# Patient Record
Sex: Male | Born: 1980 | Race: White | Hispanic: No | Smoking: Never smoker
Health system: Southern US, Community
[De-identification: ages and names within clinical notes are randomized; demographics above are authoritative.]

## PROBLEM LIST (undated history)

## (undated) DIAGNOSIS — T7840XA Allergy, unspecified, initial encounter: Secondary | ICD-10-CM

## (undated) DIAGNOSIS — G4733 Obstructive sleep apnea (adult) (pediatric): Secondary | ICD-10-CM

## (undated) DIAGNOSIS — M818 Other osteoporosis without current pathological fracture: Secondary | ICD-10-CM

## (undated) DIAGNOSIS — I639 Cerebral infarction, unspecified: Secondary | ICD-10-CM

## (undated) DIAGNOSIS — L409 Psoriasis, unspecified: Secondary | ICD-10-CM

## (undated) DIAGNOSIS — L4050 Arthropathic psoriasis, unspecified: Secondary | ICD-10-CM

## (undated) HISTORY — DX: Cerebral infarction, unspecified: I63.9

## (undated) HISTORY — PX: TONSILLECTOMY: SUR1361

## (undated) HISTORY — DX: Allergy, unspecified, initial encounter: T78.40XA

## (undated) HISTORY — DX: Other osteoporosis without current pathological fracture: M81.8

## (undated) HISTORY — DX: Arthropathic psoriasis, unspecified: L40.50

## (undated) HISTORY — DX: Obstructive sleep apnea (adult) (pediatric): G47.33

## (undated) HISTORY — DX: Psoriasis, unspecified: L40.9

---

## 2010-09-01 ENCOUNTER — Ambulatory Visit: Payer: Self-pay | Admitting: General Surgery

## 2013-12-22 ENCOUNTER — Encounter: Payer: Self-pay | Admitting: Podiatry

## 2013-12-22 ENCOUNTER — Ambulatory Visit (INDEPENDENT_AMBULATORY_CARE_PROVIDER_SITE_OTHER): Payer: BC Managed Care – PPO | Admitting: Podiatry

## 2013-12-22 ENCOUNTER — Ambulatory Visit (INDEPENDENT_AMBULATORY_CARE_PROVIDER_SITE_OTHER): Payer: BC Managed Care – PPO

## 2013-12-22 VITALS — BP 130/78 | HR 70 | Resp 16 | Ht 66.0 in | Wt 260.0 lb

## 2013-12-22 DIAGNOSIS — M722 Plantar fascial fibromatosis: Secondary | ICD-10-CM

## 2013-12-22 MED ORDER — DICLOFENAC SODIUM 75 MG PO TBEC: 75.0000 mg | DELAYED_RELEASE_TABLET | Freq: Two times a day (BID) | ORAL | Status: DC

## 2013-12-22 MED ORDER — METHYLPREDNISOLONE (PAK) 4 MG PO TABS: ORAL_TABLET | ORAL | Status: DC

## 2013-12-22 NOTE — Progress Notes (Signed)
He presents today after having not seen him for a year with a chief complaint of bilateral heel pain. He states that he was pain-free for approximately 2 months and then his heel pains slowly returned. He denies any changes in his past medical history medications allergies or surgeries.  Objective: Vital signs are stable he is alert and oriented 3 pulses are strongly palpable bilateral. He has pain on palpation medial calcaneal tubercles bilateral.  Assessment: Plantar fasciitis bilateral.  Plan: Injected the bilateral heels today with Kenalog and local anesthetic. He was scanned for a pair orthotics. Started him on a Medrol Dosepak to be followed by diclofenac. He was dispensed plantar fascial braces bilateral. He will continue sleeping in his night splint without the wedge. He will continue appropriate shoe gear stretching exercises ice therapy.

## 2013-12-30 ENCOUNTER — Telehealth: Payer: Self-pay | Admitting: *Deleted

## 2013-12-30 NOTE — Telephone Encounter (Signed)
Left message for pt to call back to schedule an appt to pick up orthotics.

## 2014-01-14 ENCOUNTER — Encounter: Payer: Self-pay | Admitting: Podiatry

## 2014-01-14 ENCOUNTER — Ambulatory Visit (INDEPENDENT_AMBULATORY_CARE_PROVIDER_SITE_OTHER): Payer: BC Managed Care – PPO | Admitting: *Deleted

## 2014-01-14 VITALS — BP 130/78 | HR 70 | Resp 16

## 2014-01-14 DIAGNOSIS — M722 Plantar fascial fibromatosis: Secondary | ICD-10-CM

## 2014-01-14 NOTE — Patient Instructions (Signed)

## 2014-01-14 NOTE — Progress Notes (Signed)
Pt presents to pick up orthotics and went over wearing instructions. 

## 2014-02-23 ENCOUNTER — Ambulatory Visit (INDEPENDENT_AMBULATORY_CARE_PROVIDER_SITE_OTHER): Payer: BC Managed Care – PPO | Admitting: Podiatry

## 2014-02-23 VITALS — BP 112/68 | HR 92 | Resp 16

## 2014-02-23 DIAGNOSIS — M722 Plantar fascial fibromatosis: Secondary | ICD-10-CM

## 2014-02-24 NOTE — Progress Notes (Signed)
He presents today for follow-up of his plantar fasciitis bilaterally he also is following up for his orthotics. He states that the orthotics hurt after proximally for 5 hours of wearing them however his heels are starting to feel better.  Objective: Vital signs are stable he is alert and oriented 3. Pulses are palpable bilateral. Minimal pain on palpation medial tubercle right knee pain on palpation left heel.  Assessment: Resolving plantar fasciitis bilateral.  Plan: Continue the use of the orthotics discussed appropriate shoe gear stretching exercises ice therapy shoe modifications. He will also continue all other conservative therapies until completely well I will follow-up with him in a month if necessary.

## 2015-05-27 ENCOUNTER — Emergency Department (HOSPITAL_COMMUNITY): Payer: BLUE CROSS/BLUE SHIELD

## 2015-05-27 ENCOUNTER — Encounter (HOSPITAL_COMMUNITY): Payer: Self-pay | Admitting: Emergency Medicine

## 2015-05-27 ENCOUNTER — Inpatient Hospital Stay (HOSPITAL_COMMUNITY)
Admission: EM | Admit: 2015-05-27 | Discharge: 2015-06-01 | DRG: 041 | Disposition: A | Discharge status: Home / Self Care | Payer: BLUE CROSS/BLUE SHIELD | Attending: Internal Medicine | Admitting: Internal Medicine

## 2015-05-27 DIAGNOSIS — Z6841 Body Mass Index (BMI) 40.0 and over, adult: Secondary | ICD-10-CM

## 2015-05-27 DIAGNOSIS — E785 Hyperlipidemia, unspecified: Secondary | ICD-10-CM | POA: Diagnosis present

## 2015-05-27 DIAGNOSIS — I639 Cerebral infarction, unspecified: Secondary | ICD-10-CM | POA: Diagnosis not present

## 2015-05-27 DIAGNOSIS — D72829 Elevated white blood cell count, unspecified: Secondary | ICD-10-CM | POA: Diagnosis present

## 2015-05-27 DIAGNOSIS — H539 Unspecified visual disturbance: Secondary | ICD-10-CM | POA: Diagnosis present

## 2015-05-27 DIAGNOSIS — G4730 Sleep apnea, unspecified: Secondary | ICD-10-CM | POA: Diagnosis present

## 2015-05-27 DIAGNOSIS — Q211 Atrial septal defect: Secondary | ICD-10-CM

## 2015-05-27 DIAGNOSIS — Q2112 Patent foramen ovale: Secondary | ICD-10-CM

## 2015-05-27 DIAGNOSIS — Q2111 Secundum atrial septal defect: Secondary | ICD-10-CM | POA: Diagnosis present

## 2015-05-27 DIAGNOSIS — Z0189 Encounter for other specified special examinations: Secondary | ICD-10-CM

## 2015-05-27 DIAGNOSIS — I253 Aneurysm of heart: Secondary | ICD-10-CM | POA: Diagnosis present

## 2015-05-27 LAB — I-STAT CHEM 8, ED
BUN: 13 mg/dL (ref 6–20)
CALCIUM ION: 1.13 mmol/L (ref 1.12–1.23)
CHLORIDE: 102 mmol/L (ref 101–111)
Creatinine, Ser: 1 mg/dL (ref 0.61–1.24)
GLUCOSE: 109 mg/dL — AB (ref 65–99)
HCT: 45 % (ref 39.0–52.0)
Hemoglobin: 15.3 g/dL (ref 13.0–17.0)
POTASSIUM: 4 mmol/L (ref 3.5–5.1)
Sodium: 142 mmol/L (ref 135–145)
TCO2: 30 mmol/L (ref 0–100)

## 2015-05-27 LAB — DIFFERENTIAL
BASOS ABS: 0 10*3/uL (ref 0.0–0.1)
BASOS PCT: 0 %
Eosinophils Absolute: 0.3 10*3/uL (ref 0.0–0.7)
Eosinophils Relative: 2 %
LYMPHS PCT: 35 %
Lymphs Abs: 4 10*3/uL (ref 0.7–4.0)
MONO ABS: 0.5 10*3/uL (ref 0.1–1.0)
Monocytes Relative: 4 %
NEUTROS ABS: 6.6 10*3/uL (ref 1.7–7.7)
NEUTROS PCT: 58 %

## 2015-05-27 LAB — TROPONIN I

## 2015-05-27 LAB — COMPREHENSIVE METABOLIC PANEL
ALBUMIN: 3.8 g/dL (ref 3.5–5.0)
ALK PHOS: 64 U/L (ref 38–126)
ALT: 26 U/L (ref 17–63)
AST: 25 U/L (ref 15–41)
Anion gap: 9 (ref 5–15)
BUN: 9 mg/dL (ref 6–20)
CALCIUM: 9.2 mg/dL (ref 8.9–10.3)
CHLORIDE: 104 mmol/L (ref 101–111)
CO2: 26 mmol/L (ref 22–32)
CREATININE: 0.98 mg/dL (ref 0.61–1.24)
GFR calc Af Amer: >60 mL/min (ref >60)
GFR calc non Af Amer: >60 mL/min (ref >60)
GLUCOSE: 120 mg/dL — AB (ref 65–99)
Potassium: 3.6 mmol/L (ref 3.5–5.1)
SODIUM: 139 mmol/L (ref 135–145)
Total Bilirubin: 0.6 mg/dL (ref 0.3–1.2)
Total Protein: 7 g/dL (ref 6.5–8.1)

## 2015-05-27 LAB — CBC
HCT: 43.6 % (ref 39.0–52.0)
Hemoglobin: 14.1 g/dL (ref 13.0–17.0)
MCH: 25.7 pg — ABNORMAL LOW (ref 26.0–34.0)
MCHC: 32.3 g/dL (ref 30.0–36.0)
MCV: 79.6 fL (ref 78.0–100.0)
PLATELETS: 340 10*3/uL (ref 150–400)
RBC: 5.48 MIL/uL (ref 4.22–5.81)
RDW: 13.8 % (ref 11.5–15.5)
WBC: 11.3 10*3/uL — AB (ref 4.0–10.5)

## 2015-05-27 LAB — PROTIME-INR
INR: 1 (ref 0.00–1.49)
PROTHROMBIN TIME: 13.4 s (ref 11.6–15.2)

## 2015-05-27 LAB — APTT: APTT: 32 s (ref 24–37)

## 2015-05-27 LAB — I-STAT TROPONIN, ED: Troponin i, poc: 0 ng/mL (ref 0.00–0.08)

## 2015-05-27 LAB — CBG MONITORING, ED: GLUCOSE-CAPILLARY: 121 mg/dL — AB (ref 65–99)

## 2015-05-27 NOTE — ED Notes (Addendum)
Pt. reports left side body , left arm and left leg numbness onset 6 : 45 am this morning , speech clear , no facial asymmetry , equal strong grips / no arm drift . Alert and oriented / respirations unlabored. Pt. added " depth perception is off" this morning.

## 2015-05-27 NOTE — ED Notes (Signed)
Patient transported to CT 

## 2015-05-28 ENCOUNTER — Inpatient Hospital Stay (HOSPITAL_COMMUNITY): Payer: BLUE CROSS/BLUE SHIELD

## 2015-05-28 ENCOUNTER — Ambulatory Visit (HOSPITAL_COMMUNITY): Payer: BLUE CROSS/BLUE SHIELD

## 2015-05-28 ENCOUNTER — Encounter (HOSPITAL_COMMUNITY): Payer: Self-pay | Admitting: Family Medicine

## 2015-05-28 ENCOUNTER — Other Ambulatory Visit (HOSPITAL_COMMUNITY): Payer: BLUE CROSS/BLUE SHIELD

## 2015-05-28 ENCOUNTER — Encounter (HOSPITAL_COMMUNITY): Payer: BLUE CROSS/BLUE SHIELD

## 2015-05-28 ENCOUNTER — Ambulatory Visit: Payer: Self-pay | Admitting: Family Medicine

## 2015-05-28 DIAGNOSIS — I639 Cerebral infarction, unspecified: Secondary | ICD-10-CM

## 2015-05-28 DIAGNOSIS — I6350 Cerebral infarction due to unspecified occlusion or stenosis of unspecified cerebral artery: Secondary | ICD-10-CM

## 2015-05-28 DIAGNOSIS — I6789 Other cerebrovascular disease: Secondary | ICD-10-CM | POA: Diagnosis not present

## 2015-05-28 DIAGNOSIS — I6340 Cerebral infarction due to embolism of unspecified cerebral artery: Secondary | ICD-10-CM

## 2015-05-28 DIAGNOSIS — Z8673 Personal history of transient ischemic attack (TIA), and cerebral infarction without residual deficits: Secondary | ICD-10-CM | POA: Diagnosis not present

## 2015-05-28 DIAGNOSIS — Z0189 Encounter for other specified special examinations: Secondary | ICD-10-CM

## 2015-05-28 DIAGNOSIS — Z008 Encounter for other general examination: Secondary | ICD-10-CM | POA: Diagnosis not present

## 2015-05-28 DIAGNOSIS — Q211 Atrial septal defect: Secondary | ICD-10-CM | POA: Diagnosis not present

## 2015-05-28 DIAGNOSIS — G4730 Sleep apnea, unspecified: Secondary | ICD-10-CM | POA: Diagnosis present

## 2015-05-28 DIAGNOSIS — E785 Hyperlipidemia, unspecified: Secondary | ICD-10-CM | POA: Diagnosis present

## 2015-05-28 DIAGNOSIS — Q2112 Patent foramen ovale: Secondary | ICD-10-CM | POA: Insufficient documentation

## 2015-05-28 DIAGNOSIS — D72829 Elevated white blood cell count, unspecified: Secondary | ICD-10-CM | POA: Diagnosis present

## 2015-05-28 DIAGNOSIS — H539 Unspecified visual disturbance: Secondary | ICD-10-CM | POA: Diagnosis present

## 2015-05-28 DIAGNOSIS — I6349 Cerebral infarction due to embolism of other cerebral artery: Secondary | ICD-10-CM | POA: Diagnosis not present

## 2015-05-28 DIAGNOSIS — Z6841 Body Mass Index (BMI) 40.0 and over, adult: Secondary | ICD-10-CM | POA: Diagnosis not present

## 2015-05-28 DIAGNOSIS — I253 Aneurysm of heart: Secondary | ICD-10-CM | POA: Diagnosis present

## 2015-05-28 HISTORY — DX: Cerebral infarction, unspecified: I63.9

## 2015-05-28 LAB — LIPID PANEL
CHOL/HDL RATIO: 7.4 ratio
CHOLESTEROL: 214 mg/dL — AB (ref 0–200)
HDL: 29 mg/dL — ABNORMAL LOW (ref >40)
LDL Cholesterol: 123 mg/dL — ABNORMAL HIGH (ref 0–99)
Triglycerides: 309 mg/dL — ABNORMAL HIGH (ref <150)
VLDL: 62 mg/dL — AB (ref 0–40)

## 2015-05-28 LAB — GLUCOSE, CAPILLARY
GLUCOSE-CAPILLARY: 92 mg/dL (ref 65–99)
GLUCOSE-CAPILLARY: 107 mg/dL — AB (ref 65–99)
Glucose-Capillary: 101 mg/dL — ABNORMAL HIGH (ref 65–99)
Glucose-Capillary: 105 mg/dL — ABNORMAL HIGH (ref 65–99)

## 2015-05-28 LAB — ECHOCARDIOGRAM COMPLETE
HEIGHTINCHES: 66 in
Weight: 4672 oz

## 2015-05-28 LAB — C-REACTIVE PROTEIN: CRP: 0.7 mg/dL (ref <1.0)

## 2015-05-28 LAB — SEDIMENTATION RATE: SED RATE: 32 mm/h — AB (ref 0–16)

## 2015-05-28 MED ORDER — GADOBENATE DIMEGLUMINE 529 MG/ML IV SOLN
20.0000 mL | Freq: Once | INTRAVENOUS | Status: AC | PRN
  Administered 2015-05-28: 20 mL via INTRAVENOUS

## 2015-05-28 MED ORDER — ASPIRIN 300 MG RE SUPP: 300.0000 mg | Freq: Every day | RECTAL | Status: DC

## 2015-05-28 MED ORDER — ENOXAPARIN SODIUM 40 MG/0.4ML ~~LOC~~ SOLN
40.0000 mg | Freq: Every day | SUBCUTANEOUS | Status: DC
  Administered 2015-05-28 – 2015-06-01 (×5): 40 mg via SUBCUTANEOUS
  Filled 2015-05-28 (×5): qty 0.4

## 2015-05-28 MED ORDER — STROKE: EARLY STAGES OF RECOVERY BOOK
Freq: Once | Status: AC
  Administered 2015-05-28: 03:00:00
  Filled 2015-05-28: qty 1

## 2015-05-28 MED ORDER — IOHEXOL 350 MG/ML SOLN
50.0000 mL | Freq: Once | INTRAVENOUS | Status: AC | PRN
  Administered 2015-05-28: 50 mL via INTRAVENOUS

## 2015-05-28 MED ORDER — SENNOSIDES-DOCUSATE SODIUM 8.6-50 MG PO TABS: 1.0000 | ORAL_TABLET | Freq: Every evening | ORAL | Status: DC | PRN

## 2015-05-28 MED ORDER — ASPIRIN 325 MG PO TABS
325.0000 mg | ORAL_TABLET | Freq: Every day | ORAL | Status: DC
  Administered 2015-05-28 – 2015-06-01 (×5): 325 mg via ORAL
  Filled 2015-05-28 (×5): qty 1

## 2015-05-28 MED ORDER — ATORVASTATIN CALCIUM 80 MG PO TABS
80.0000 mg | ORAL_TABLET | Freq: Every day | ORAL | Status: DC
  Administered 2015-05-28 – 2015-05-31 (×4): 80 mg via ORAL
  Filled 2015-05-28 (×4): qty 1

## 2015-05-28 NOTE — Progress Notes (Signed)
  Echocardiogram 2D Echocardiogram has been performed.  Austin SavoyCasey Fuentes Austin Fuentes 05/28/2015, 2:04 PM

## 2015-05-28 NOTE — ED Notes (Signed)
Patient off the unit of mri

## 2015-05-28 NOTE — ED Notes (Signed)
Darylene PriceGenevieve, rn, reveiving nurse on unit. Report given.

## 2015-05-28 NOTE — ED Notes (Signed)
Dr. Antionette Charpyd a tthe bedside, hospitalist.

## 2015-05-28 NOTE — Consult Note (Signed)
Neurology Consultation Reason for Consult: Stroke Referring Physician: Patient  CC: depth percception problem  History is obtained from:Patient  HPI: Austin PortsJason T Visconti is a 35 y.o. male with  No past medical history who presents with abnormal sensation of depth perception being off on his left side that started this morning. He states that he was last normal prior to bed last night and subsequently on awakening this morning he reached for his phone and felt like his "left arm felt like jelly." throughout the day he has been reaching for things and they have not been where he expected them to be. Finally, his wife convinced him to come in and be checked out and a head CT revealed a hypodensity in the right parietal region consistent with infarct.   he denies any recent illnesses other than slight cough for the past couple of weeks. He denies headaches, denies neck pain.  LKW:  3/29 prior to bed tpa given?: no,  Out of window Premorbid modified rankin scale:  0    ROS: A 14 point ROS was performed and is negative except as noted in the HPI.   Past Medical History  Diagnosis Date  . Allergy      Family history: no history of clotting disorders,  Blood clots in the lungs or legs,  Stroke at young age,  Social History:  reports that he has never smoked. He has never used smokeless tobacco. He reports that he does not drink alcohol or use illicit drugs.   Exam: Current vital signs: BP 156/83 mmHg  Pulse 112  Temp(Src) 98.6 F (37 C) (Oral)  Resp 18  SpO2 96% Vital signs in last 24 hours: Temp:  [98.6 F (37 C)] 98.6 F (37 C) (03/30 2209) Pulse Rate:  [112] 112 (03/30 2209) Resp:  [18] 18 (03/30 2209) BP: (156)/(83) 156/83 mmHg (03/30 2209) SpO2:  [96 %] 96 % (03/30 2209)   Physical Exam  Constitutional: Appears well-developed and well-nourished.  Psych: Affect appropriate to situation Eyes: No scleral injection HENT: No OP obstrucion Head: Normocephalic.   Cardiovascular: Normal rate and regular rhythm.  Respiratory: Effort normal and breath sounds normal to anterior ascultation GI: Soft.  No distension. There is no tenderness.  Skin: WDI  Neuro: Mental Status: Patient is awake, alert, oriented to person, place, month, year, and situation. Patient is able to give a clear and coherent history. No signs of aphasia or neglect Cranial Nerves: II: Visual Fields are full. Pupils are equal, round, and reactive to light.   III,IV, VI: EOMI without ptosis or diploplia.  V: Facial sensation is "tingly"  To pinprick on the left home but still feels equally sharp on both sides VII: Facial movement is symmetric.  VIII: hearing is intact to voice X: Uvula elevates symmetrically XI: Shoulder shrug is symmetric. XII: tongue is midline without atrophy or fasciculations.  Motor: Tone is normal. Bulk is normal. 5/5 strength was present in all four extremities.  Sensory: Sensation is    subjectively different, though he states that both touch and pinprick feel equal bilaterally. Cerebellar: FNF intact bilaterally    I have reviewed labs in epic and the results pertinent to this consultation are:  CMP-unremarkable  I have reviewed the images obtained: CT head-hypodensity in the right parietal region  Impression:  35 year old male with what appears to be a right parietal infarct. Given his young age, this is concerning and he does need an extensive workup. I would favor staging at with the normal  testing first, and if no clear etiology is found then proceeding with further testing including hypercoagulability and TEE.  Recommendations: 1. HgbA1c, fasting lipid panel 2. MRI of the brain with/without contrast 3. Frequent neuro checks 4. Echocardiogram 5.  CT angiogram of the head and neck 6. Prophylactic therapy-Antiplatelet med: Aspirin - dose  PO or  PR 7. Risk factor modification 8. Telemetry monitoring 9. PT consult, OT consult,  Speech consult 10.  If above testing does not reveal etiolgy,  then would pursue further testing with hypercoag workup and TEE 11. please page stroke NP  Or  PA  Or MD  M-F from 8am -4 pm starting 3/31 as this patient will be followed by the stroke team at this point.   You can look them up on [email or web address]      Ritta Slot, MD [facility] [phone]  If 7pm- 7am, please page neurology on call as listed in AMION.

## 2015-05-28 NOTE — Care Management Note (Signed)
Case Management Note  Patient Details  Name: Austin Fuentes MRN: [id number] Date of Birth: [birth year]  Subjective/Objective:                    Action/Plan: Patient was admitted with CVA. Lives at home with spouse. Will follow for discharge needs pending PT/OT evals and physician orders.  Expected Discharge Date:                  Expected Discharge Plan:     In-House Referral:     Discharge planning Services     Post Acute Care Choice:    Choice offered to:     DME Arranged:    DME Agency:     HH Arranged:    HH Agency:     Status of Service:  In process, will continue to follow  Medicare Important Message Given:    Date Medicare IM Given:    Medicare IM give by:    Date Additional Medicare IM Given:    Additional Medicare Important Message give by:     If discussed at Long Length of Stay Meetings, dates discussed:    Additional Comments:  Anda KraftRobarge, [name] C, RN 05/28/2015, 8:55 AM [phone]

## 2015-05-28 NOTE — H&P (Signed)
[facility] History and Physical  JAYKUB MACKINS ZOX:[id number] DOB: 20-Oct-1980 DOA: 05/27/2015  Referring physician: ED physician PCP: Lanier Ensign KEY, MD  Specialists: Dr. Al Corpus, podiatry   Chief Complaint:  Left-sided numbness, vision disturbance  HPI: Austin Fuentes is a 35 y.o. male with PMH of seasonal allergy and plantar fasciitis who presents to the ED with numbness to the left side of his body and visual disturbance first noted at approximately 6:45 AM on 05/27/2015. Patient takes no prescription medications and typically enjoys good health. He went to bed in his usual state the night of 05/26/2015, and upon waking the following morning he noted his left arm to be numb. He initially thought that he must have slept on the arm, but soon realized that his left leg and face also had paresthesia. He reports that his visual acuity is intact, but he perceives a problem with his depth perception, bumping into things that he thought he was walking past. 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 He experienced some difficulty putting money in the appropriate compartment in the cash register and noted some mild cognitive slowing while at work. Eventually, upon his wife's insistence, he came into the ED for evaluation. He has never experienced similar symptoms previously and denies any personal or family history of VTE or premature heart attack. He has never smoked, does not drink alcohol, and denies use of illicit drugs. There's been no unilateral leg swelling or tenderness and he has never experienced palpitations. He reports that his symptoms are unchanged from time of onset.  In ED, patient was found to be afebrile, tachycardic in the low 100s, and with vital signs otherwise stable. BMP is largely unremarkable and CBC is notable only for a leukocytosis of 11,300. INR is  1.00 and troponin has been negative 2. A contrast-enhanced CT of the head was obtained and findings are suggestive of a subacute or acute right parietal lobe infarct. Neurology was consulted by the EDP and evaluated the patient in the ED. He has been recommended for admission to the hospital for ongoing evaluation and management of acute ischemic CVA.  Where does patient live?   At home     Can patient participate in ADLs?  Yes         Review of Systems:   General: no fevers, chills, sweats, weight change, poor appetite, or fatigue HEENT: no blurry vision, hearing changes or sore throat. Depth perception diminished Pulm: no dyspnea, cough, or wheeze CV: no chest pain or palpitations Abd: no nausea, vomiting, abdominal pain, diarrhea, or constipation GU: no dysuria, hematuria, increased urinary frequency, or urgency  Ext: no leg edema Neuro: no focal weakness or hearing loss. Left-sided face and body paraesthesia Skin: no rash, no wounds MSK: No muscle spasm, no deformity, no red, hot, or swollen joint Heme: No easy bruising or bleeding Travel history: No recent long distant travel    Allergy: No Known Allergies  Past Medical History  Diagnosis Date  . Allergy     Past Surgical History  Procedure Laterality Date  . Tonsillectomy      Social History:  reports that he has never smoked. He has never used smokeless tobacco. He reports that he does not drink alcohol or use illicit drugs.  Family History: History reviewed. No pertinent family history.   Prior to Admission medications   Medication Sig Start  Date End Date Taking? Authorizing Provider  ibuprofen (ADVIL,MOTRIN) 200 MG tablet Take 600 mg by mouth every 6 (six) hours as needed for headache.   Yes Historical Provider, MD    Physical Exam: Filed Vitals:   05/27/15 2209 05/28/15 0030 05/28/15 0045 05/28/15 0217  BP: 156/83 114/85 105/75 116/65  Pulse: 112 93 96 91  Temp: 98.6 F (37 C)     TempSrc: Oral     Resp: SpO2: 96% 98% 97% 97%   General: Not in acute distress HEENT:       Eyes: PERRL, EOMI, no scleral icterus or conjunctival pallor.       ENT: No discharge from the ears or nose, no pharyngeal ulcers, oral mucosa moist.        Neck: No JVD, no bruit, no appreciable mass Heme: No cervical adenopathy, no pallor Cardiac: S1/S2, RRR, No murmurs, No gallops or rubs. Pulm: Good air movement bilaterally. No rales, wheezing, rhonchi or rubs. Abd: Soft, nondistended, nontender, no rebound pain or gaurding, BS present. Ext: No LE edema bilaterally. 2+DP/PT pulse bilaterally. Musculoskeletal: No gross deformity, no red, hot, swollen joints   Skin: No rashes or wounds on exposed surfaces  Neuro: Alert, oriented X3, cranial nerves II-XII grossly intact, muscle strength 5/5 in all extremities, sensation to light touch deficient in left upper and lower extremities, and left side of face. Patellar DTR 2+ and symmetric. Babinski down-going b/l.   Psych: Patient is not overtly psychotic, appropriate mood and affect.  Labs on Admission:  Basic Metabolic Panel:  Recent Labs Lab 05/27/15 2259 05/27/15 2307  NA 139 142  K 3.6 4.0  CL 104 102  CO2 26  --   GLUCOSE 120* 109*  BUN 9 13  CREATININE 0.98 1.00  CALCIUM 9.2  --    Liver Function Tests:  Recent Labs Lab 05/27/15 2259  AST 25  ALT 26  ALKPHOS 64  BILITOT 0.6  PROT 7.0  ALBUMIN 3.8   No results for input(s): LIPASE, AMYLASE in the last 168 hours. No results for input(s): AMMONIA in the last 168 hours. CBC:  Recent Labs Lab 05/27/15 2259 05/27/15 2307  WBC 11.3*  --   NEUTROABS 6.6  --   HGB 14.1 15.3  HCT 43.6 45.0  MCV 79.6  --   PLT 340  --    Cardiac Enzymes:  Recent Labs Lab 05/27/15 2259  TROPONINI <0.03    BNP (last 3 results) No results for input(s): BNP in the last 8760 hours.  ProBNP (last 3 results) No results for input(s): PROBNP in the last 8760 hours.  CBG:  Recent Labs Lab  05/27/15 2241  GLUCAP 121*    Radiological Exams on Admission: Ct Head Wo Contrast  05/28/2015  ADDENDUM REPORT: 05/28/2015 00:09 ADDENDUM: These results were called by telephone at the time of interpretation on 05/28/2015 at 12:06 am to Dr. Blinda Leatherwood , who verbally acknowledged these results. Electronically Signed   By: Elgie Collard M.D.   On: 05/28/2015 00:09  05/28/2015  CLINICAL DATA:  35 year old male with left-sided weakness and numbness. EXAM: CT HEAD WITHOUT CONTRAST TECHNIQUE: Contiguous axial images were obtained from the base of the skull through the vertex without intravenous contrast. COMPARISON:  None. FINDINGS: There is no acute intracranial hemorrhage. There is a focal area of hypodensity in the right posterior parietal convexity with involvement of the gray and white matter and loss of gray-white matter discrimination most compatible with an ischemic  infarct, likely subacute and less likely acute. Correlation with clinical exam and duration of neurologic symptoms and further evaluation with MRI recommended. The ventricles and sulci are appropriate size for patient's age. There is no mass effect or midline shift. There is mucoperiosteal thickening of the left maxillary sinus and left sphenoid sinus as well as mucoperiosteal thickening of multiple ethmoid air cells. No air-fluid levels. The mastoid air cells are clear. The calvarium is intact. 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 Correlation with clinical exam and further evaluation with MRI recommended. No acute intracranial hemorrhage. Electronically Signed: By: Elgie CollardArash  Radparvar M.D. On: 05/27/2015 23:56    EKG:  Ordered and pending   Assessment/Plan  1. Acute ischemic CVA  - Went to bed 3/29 in normal state and woke 6:45 am on 05/27/15 with aforementioned sxs  - CT revealing of subacute or acute right parietal lobe infarct  - MRI w/ and w/o contrast has been performed, read pending  - Monitor  on telemetry  - NPO until swallow screen is passed  - CTA head and neck ordered  - TTE ordered  - Check lipid panel and A1c, treat as indicated  - PT/OT/SLP evals  - Secondary ppx with ASA  - Neurology is on the case and much appreciated   DVT ppx:  SQ Lovenox    Code Status: Full code Family Communication:  Yes, patient's wife at bed side Disposition Plan: Admit to inpatient   Date of Service 05/28/2015    Briscoe Deutscherimothy S Heiress Williamson, MD [facility] Pager [phone]  If 7PM-7AM, please contact night-coverage [email or web address] Password [email or web address] 05/28/2015, 2:32 AM

## 2015-05-28 NOTE — ED Provider Notes (Signed)
CSN: [id number]     Arrival date & time 05/27/15  2200 History  By signing my name below, I, Austin Fuentes, attest that this documentation has been prepared under the direction and in the presence of Austin Crease, MD. Electronically Signed: Bethel Fuentes, ED Scribe. 05/28/2015. 12:43 AM   Chief Complaint  Patient presents with  . Numbness   The history is provided by the patient. No language interpreter was used.   Austin Fuentes is a 35 y.o. male with no significant PMHx who presents to the Emergency Department complaining of new and constant left facial, arm, and leg numbness with onset near 6:45 AM yesterday. Associated symptoms include mild confusion ("I'm having to think more about what I'm doing"). He also reports that it feels as if his depth perception is "off".  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 Pt denies weakness and change in speech stating "yeah I can walk, I can talk".   Past Medical History  Diagnosis Date  . Allergy    Past Surgical History  Procedure Laterality Date  . Tonsillectomy     No family history on file. Social History  Substance Use Topics  . Smoking status: Never Smoker   . Smokeless tobacco: Never Used  . Alcohol Use: No    Review of Systems  Eyes: Positive for visual disturbance.  Musculoskeletal: Negative for gait problem.  Neurological: Positive for numbness. Negative for weakness.  Psychiatric/Behavioral: Positive for confusion.  All other systems reviewed and are negative.  Allergies  Review of patient's allergies indicates no known allergies.  Home Medications   Prior to Admission medications   Medication Sig Start Date End Date Taking? Authorizing Provider  acetaminophen (TYLENOL) 500 MG tablet Take 500 mg by mouth every 6 (six) hours as needed.    Historical Provider, MD  diclofenac (VOLTAREN) 75 MG EC tablet Take 1 tablet (75 mg total) by mouth 2 (two) times daily.  12/22/13   Austin Fuentes, DPM  methylPREDNIsolone (MEDROL DOSPACK) 4 MG tablet follow package directions 12/22/13   Austin Fuentes, DPM   BP 156/83 mmHg  Pulse 112  Temp(Src) 98.6 F (37 C) (Oral)  Resp 18  SpO2 96% Physical Exam  Constitutional: He is oriented to person, place, and time. He appears well-developed and well-nourished. No distress.  HENT:  Head: Normocephalic and atraumatic.  Right Ear: Hearing normal.  Left Ear: Hearing normal.  Nose: Nose normal.  Mouth/Throat: Oropharynx is clear and moist and mucous membranes are normal.  Eyes: Conjunctivae and EOM are normal. Pupils are equal, round, and reactive to light.  Neck: Normal range of motion. Neck supple.  Cardiovascular: Regular rhythm, S1 normal and S2 normal.  Exam reveals no gallop and no friction rub.   No murmur heard. Pulmonary/Chest: Effort normal and breath sounds normal. No respiratory distress. He exhibits no tenderness.  Abdominal: Soft. Normal appearance and bowel sounds are normal. There is no hepatosplenomegaly. There is no tenderness. There is no rebound, no guarding, no tenderness at McBurney's point and negative Murphy's sign. No hernia.  Musculoskeletal: Normal range of motion.  Neurological: He is alert and oriented to person, place, and time. He has normal strength. No cranial nerve deficit or sensory deficit. Coordination normal. GCS eye subscore is 4. GCS verbal subscore is 5. GCS motor subscore is 6.  Extraocular muscle movement: normal No visual field cut Pupils: equal and reactive both direct and consensual response is  normal No nystagmus present Visual field cut    Sensory function is intact to light touch, pinprick  Grip strength 5/5 symmetric in upper extremities No pronator drift Difficulty with finger to nose on left side  Lower extremity strength 5/5 against gravity Normal heel to shin bilaterally     Skin: Skin is warm, dry and intact. No rash noted. No cyanosis.  Psychiatric: He  has a normal mood and affect. His speech is normal and behavior is normal. Thought content normal.  Nursing note and vitals reviewed.   ED Course  Procedures (including critical care time) DIAGNOSTIC STUDIES: Oxygen Saturation is 96% on RA,  normal by my interpretation.    COORDINATION OF CARE: 12:06 AM Discussed treatment plan which includes lab work, CT head, MRI with pt at bedside and pt agreed to plan.  12:22 AM-Consult complete with Dr. Amada Jupiter (Neurology). Patient case explained and discussed. Call ended at 12:23 AM  Labs Review Labs Reviewed  CBC - Abnormal; Notable for the following:    WBC 11.3 (*)    MCH 25.7 (*)    All other components within normal limits  COMPREHENSIVE METABOLIC PANEL - Abnormal; Notable for the following:    Glucose, Bld 120 (*)    All other components within normal limits  I-STAT CHEM 8, ED - Abnormal; Notable for the following:    Glucose, Bld 109 (*)    All other components within normal limits  CBG MONITORING, ED - Abnormal; Notable for the following:    Glucose-Capillary 121 (*)    All other components within normal limits  PROTIME-INR  APTT  DIFFERENTIAL  TROPONIN I  I-STAT TROPOININ, ED    Imaging Review Ct Head Wo Contrast  05/28/2015  ADDENDUM REPORT: 05/28/2015 00:09 ADDENDUM: These results were called by telephone at the time of interpretation on 05/28/2015 at 12:06 am to Dr. Blinda Leatherwood , who verbally acknowledged these results. Electronically Signed   By: Austin Fuentes M.D.   On: 05/28/2015 00:09  05/28/2015  CLINICAL DATA:  35 year old male with left-sided weakness and numbness. EXAM: CT HEAD WITHOUT CONTRAST TECHNIQUE: Contiguous axial images were obtained from the base of the skull through the vertex without intravenous contrast. COMPARISON:  None. FINDINGS: There is no acute intracranial hemorrhage. There is a focal area of hypodensity in the right posterior parietal convexity with involvement of the gray and white matter and  loss of gray-white matter discrimination most compatible with an ischemic infarct, likely subacute and less likely acute. Correlation with clinical exam and duration of neurologic symptoms and further evaluation with MRI recommended. The ventricles and sulci are appropriate size for patient's age. There is no mass effect or midline shift. There is mucoperiosteal thickening of the left maxillary sinus and left sphenoid sinus as well as mucoperiosteal thickening of multiple ethmoid air cells. No air-fluid levels. The mastoid air cells are clear. The calvarium is intact. 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 Correlation with clinical exam and further evaluation with MRI recommended. No acute intracranial hemorrhage. Electronically Signed: By: Austin Fuentes M.D. On: 05/27/2015 23:56   I have personally reviewed and evaluated these images and lab results as part of my medical decision-making.   EKG Interpretation None     ED ECG REPORT   Date: 05/28/2015  Rate: 111  Rhythm: sinus tachycardia  QRS Axis: right  Intervals: normal  ST/T Wave abnormalities: normal  Conduction Disutrbances:none  Narrative Interpretation:   Old EKG Reviewed: none available  I have  personally reviewed the EKG tracing and agree with the computerized printout as noted.   MDM   Final diagnoses:  None  CVA  Presents to the emergency department with left-sided numbness and tingling since awakening this morning. He reports that initially he thought he slept on his arm wrong, and that was asleep. He reports that throughout the day, however, symptoms have not improved. He reports numbness of the left side of his face, left arm and left leg. He reports that he has been having difficulty with his "depth perception". He says he has been trying to walk around objects and has been walking into them, difficulty performing tasks with his left hand.  Termination does reveal some mild  difficulty with either proprioception or fine motor, but otherwise no significant deficits. CT scan, however, does show evidence of infarct. Patient will be hospitalized for further workup including MRI. Dr. Amada JupiterKirkpatrick, neurology, has seen the patient and performed consultation.  I personally performed the services described in this documentation, which was scribed in my presence. The recorded information has been reviewed and is accurate.    Austin Creasehristopher J Arsh Feutz, MD 05/28/15 [phone]

## 2015-05-28 NOTE — Progress Notes (Signed)
    [facility] has been requested to perform a transesophageal echocardiogram on 04/03 for possible PFO.  After careful review of history and examination, the risks and benefits of transesophageal echocardiogram have been explained including risks of esophageal damage, perforation (1:10,000 risk), bleeding, pharyngeal hematoma as well as other potential complications associated with conscious sedation including aspiration, arrhythmia, respiratory failure and death. Alternatives to treatment were discussed, questions were answered. Patient is willing to proceed.   Leanna BattlesBarrett, Ginnette Gates, PA-C 05/28/2015 4:32 PM

## 2015-05-28 NOTE — ED Notes (Signed)
Dr. Blinda Leatherwoodpollina speaks with radiologist about CT results.

## 2015-05-28 NOTE — Progress Notes (Signed)
Patient arrived to 5M16 AAOx4 and pleasant. Patient had no issues walking to the wheelchair for transport. Vitals taken, tele placed, and call bell at his side. Will continue to monitor. Alejos Reinhardt, [place] ScrapeSarah E, RN

## 2015-05-28 NOTE — Progress Notes (Signed)
Patient was in CT at 0430, neuro check and vital signs not done at this time.

## 2015-05-28 NOTE — Progress Notes (Signed)
*  PRELIMINARY RESULTS* Vascular Ultrasound Transcranial Doppler with Bubbles has been completed with Dr. Roda ShuttersXu. Four High Intensity Transient Signals (HITS) were heard at rest, and ten HITS were heard with Valsalva, suggestive of a possible small PFO.  Bilateral lower extremity venous duplex completed. Bilateral lower extremities are negative for deep vein thrombosis. There is no evidence of Baker's cyst bilaterally.  05/28/2015  Gertie FeyMichelle Shykeem Resurreccion, RVT, RDCS, RDMS

## 2015-05-28 NOTE — ED Notes (Signed)
Dr Amada JupiterKirkpatrick at the bedside, neurology

## 2015-05-28 NOTE — Procedures (Signed)
[facility]  [address]  [place] [id number].  [phone]   TRANSCRANIAL DOPPLER BUBBLE STUDY  Austin PortsJason T Fuentes  Date of Birth: [birth year] Medical Record Number: [id number] Indications: stroke in young Date of Procedure: 05/28/2015 Clinical History: stroke in young Technical Description: Transcranial Doppler Bubble Study was performed at the bedside after taking written informed consent from the patient and explaining risk/benefits. The left middle cerebral artery was insonated using a hand held probe. And IV line had been previously inserted in the left forearm by the RN using aseptic precautions. Agitated saline injection at rest resulted in 4 HITS and after valsalva maneuver resulted in 10 high intensity transient signals (HITS).  Impression: Possible positive Transcranial Doppler Bubble Study indicative of a very small right to left intracardiac   Results were explained to the patient. Questions were answered. I had a long discussion with the patient with regards to the role of patent foramen ovale and risk of stroke. It is unclear at the present time whether his possible very small PFO has definite relation to his current stroke. Will proceed with TEE and possible loop if indicated.   Austin PlanJindong Ananias Kolander, MD PhD Stroke Neurology 05/28/2015 6:35 PM

## 2015-05-28 NOTE — ED Notes (Signed)
hospitalist at the bedside 

## 2015-05-28 NOTE — Progress Notes (Signed)
STROKE TEAM PROGRESS NOTE   HISTORY OF PRESENT ILLNESS Austin Fuentes is a 35 y.o. male with No past medical history who presents with abnormal sensation of depth perception being off on his left side that started this morning. He states that he was last normal prior to bed last night and subsequently on awakening this morning he reached for his phone and felt like his "left arm felt like jelly." throughout the day he has been reaching for things and they have not been where he expected them to be. Finally, his wife convinced him to come in and be checked out and a head CT revealed a hypodensity in the right parietal region consistent with infarct.  He denies any recent illnesses other than slight cough for the past couple of weeks. He denies headaches, denies neck pain.  LKW: 3/29 prior to bed tpa given?: no, Out of window Premorbid modified rankin scale: 0   SUBJECTIVE (INTERVAL HISTORY) The patient's wife is at the bedside. The patient complains of decreased depth perception at left upper extremity, but exam essentially normal. He is overweight and also s/s of OSA. Will need TCD bubble and TEE.    OBJECTIVE Temp:  [97.6 F (36.4 C)-98.6 F (37 C)] 98.4 F (36.9 C) (03/31 1602) Pulse Rate:  [58-112] 58 (03/31 1602) Cardiac Rhythm:  [-] Normal sinus rhythm (03/31 0720) Resp:  [16-20] 20 (03/31 1602) BP: (99-156)/(64-85) 137/84 mmHg (03/31 1602) SpO2:  [95 %-100 %] 100 % (03/31 1602) Weight:  [132.45 kg (292 lb)] 132.45 kg (292 lb) (03/31 0304)  CBC:   Recent Labs Lab 05/27/15 2259 05/27/15 2307  WBC 11.3*  --   NEUTROABS 6.6  --   HGB 14.1 15.3  HCT 43.6 45.0  MCV 79.6  --   PLT 340  --     Basic Metabolic Panel:   Recent Labs Lab 05/27/15 2259 05/27/15 2307  NA 139 142  K 3.6 4.0  CL 104 102  CO2 26  --   GLUCOSE 120* 109*  BUN 9 13  CREATININE 0.98 1.00  CALCIUM 9.2  --     Lipid Panel:     Component Value Date/Time   CHOL 214* 05/28/2015 0551    TRIG 309* 05/28/2015 0551   HDL 29* 05/28/2015 0551   CHOLHDL 7.4 05/28/2015 0551   VLDL 62* 05/28/2015 0551   LDLCALC 123* 05/28/2015 0551   HgbA1c: No results found for: HGBA1C Urine Drug Screen: No results found for: LABOPIA, COCAINSCRNUR, LABBENZ, AMPHETMU, THCU, LABBARB    IMAGING I have personally reviewed the radiological images below and agree with the radiology interpretations.  Ct Angio Head and Neck  W/cm &/or Wo Cm 05/28/2015   Normal CTA of the head and neck.   Ct Head Wo Contrast 05/28/2015   Focal right parietal lobe infarct, likely subacute or acute. Correlation with clinical exam and further evaluation with MRI recommended. No acute intracranial hemorrhage.   Mr Lodema Pilot Contrast 05/28/2015   1. Acute ischemic nonhemorrhagic right parietal lobe infarcts without significant mass effect.  2. Additional subcentimeter cortical ischemic nonhemorrhagic left parietal lobe infarct.  3. Remote infarct involving the superior right cerebellar vermis.  4. Mild chronic small vessel ischemic disease.   2-D echocardiogram 05/28/2015 Study Conclusions - Left ventricle: The cavity size was normal. Systolic function was  normal. The estimated ejection fraction was in the range of 60%  to 65%. Wall motion was normal; there were no regional wall  motion abnormalities. Left  ventricular diastolic function  parameters were normal. - Aortic valve: There was no regurgitation. - Aortic root: The aortic root was normal in size. - Mitral valve: There was trivial regurgitation. - Right ventricle: The cavity size was normal. Wall thickness was  normal. Systolic function was normal. - Right atrium: The atrium was normal in size. - Tricuspid valve: There was no regurgitation. - Pulmonic valve: There was no regurgitation. - Pulmonary arteries: Systolic pressure was within the normal  range. - Inferior vena cava: The vessel was normal in size. - Pericardium, extracardiac: There was  no pericardial effusion. Impressions: - Normal study.  TCD bubble study - likely very small PFO  LE venous doppler - negative for DVT   PHYSICAL EXAM  Temp:  [97.6 F (36.4 C)-98.6 F (37 C)] 98 F (36.7 C) (03/31 1707) Pulse Rate:  [58-112] 86 (03/31 1707) Resp:  [16-20] 20 (03/31 1707) BP: (99-156)/(64-85) 104/67 mmHg (03/31 1707) SpO2:  [94 %-100 %] 94 % (03/31 1707) Weight:  [292 lb (132.45 kg)] 292 lb (132.45 kg) (03/31 0304)  General - Well nourished, well developed, in no apparent distress.  Ophthalmologic - Sharp disc margins OU.  Cardiovascular - Regular rate and rhythm with no murmur.  Mental Status -  Level of arousal and orientation to time, place, and person were intact. Language including expression, naming, repetition, comprehension was assessed and found intact. Fund of Knowledge was assessed and was intact.  Cranial Nerves II - XII - II - Visual field intact OU. III, IV, VI - Extraocular movements intact. V - Facial sensation intact bilaterally. VII - Facial movement intact bilaterally. VIII - Hearing & vestibular intact bilaterally. X - Palate elevates symmetrically. XI - Chin turning & shoulder shrug intact bilaterally. XII - Tongue protrusion intact.  Motor Strength - The patient's strength was normal in all extremities and pronator drift was absent.  Bulk was normal and fasciculations were absent.   Motor Tone - Muscle tone was assessed at the neck and appendages and was normal.  Reflexes - The patient's reflexes were 1+ in all extremities and he had no pathological reflexes.  Sensory - Light touch, temperature/pinprick, vibration and proprioception were assessed and were symmetrical.    Coordination - The patient had normal movements in the hands with no ataxia or dysmetria.  Tremor was absent.  Gait and Station - deferred.   ASSESSMENT/PLAN Austin Fuentes is a 35 y.o. male with no significant past medical history presenting with depth  perception abnormality and abnormal sensation left upper extremity. He did not receive IV t-PA due to late presentation and minimal deficits.  Strokes:  Bilateral infarcts probably embolic from an unknown source. 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   MRI -  Acute ischemic nonhemorrhagic right parietal lobe infarcts with subcentimeter cortical ischemic nonhemorrhagic left parietal lobe infarct. Remote infarct involving the superior right cerebellar vermis.  CTA head and neck - normal  Transcranial Dopplers - likely very small PFO  Lower extremity venous Dopplers - negative for DVT.   2D Echo EF 60-65%.  Recommend TEE and possible loop recorder as lack of stroke etiology  LDL 123  Hyper coagulable labs - pending  HgbA1c pending  VTE prophylaxis - Lovenox Diet Heart Room service appropriate?: Yes; Fluid consistency:: Thin  No antithrombotic prior to admission, now on aspirin 325 mg daily.   Patient counseled to be compliant with his antithrombotic medications  Ongoing aggressive stroke risk factor management  Therapy recommendations: pending  Disposition: Pending  Likely very small PFO  TCD bubble study spencer degree I even with valsalva  Not sure the relation to current stroke  TEE pending  Hyperlipidemia  Home meds:  No lipid lowering medications prior to admission.  LDL 123, goal < 70  Now on Lipitor 80 mg daily  Continue statin at discharge  Other Stroke Risk Factors  Advanced age  Obesity, Body mass index is 47.15 kg/(m^2).   Hx of previous stroke - by MRI  S/s OSA - need outpt sleep study  Other Active Problems  Mild leukocytosis - afebrile   Hospital day # 0  Marvel Plan, MD PhD Stroke Neurology 05/28/2015 6:41 PM    To contact Stroke Continuity provider, please refer to [email or web address]. After hours, contact General Neurology

## 2015-05-28 NOTE — Plan of Care (Signed)
[facility] PLAN OF CARE NOTE Patient: Austin Fuentes GNF:[id number]   PCP: Lanier EnsignSOLES, MEREDITH KEY, MD DOB: [birth year]   DOA: 05/27/2015   DOS: 05/28/2015    Patient was admitted by my colleague Dr. Antionette Charpyd earlier on 05/28/2015. I have reviewed the H&P as well as assessment and plan and agree with the same. Important changes in the plan are listed below.  Plan of care: Principal Problem:   CVA (cerebral vascular accident) [facility](HCC) Awaiting physical therapy and occupational therapy as well as neurology recommendation    Needs sleep apnea assessment   Severe obesity (BMI >= 40) (HCC) Will need outpatient sleep study to reduce stroke risk    Hyperlipidemia Add Lipitor   Author: Lynden OxfordPranav Marsh Heckler, MD [facility] Pager: [phone] 05/28/2015 1:38 PM   If 7PM-7AM, please contact night-coverage at [email or web address], password [email or web address]

## 2015-05-29 ENCOUNTER — Encounter (HOSPITAL_COMMUNITY): Payer: Self-pay | Admitting: *Deleted

## 2015-05-29 DIAGNOSIS — Z8673 Personal history of transient ischemic attack (TIA), and cerebral infarction without residual deficits: Secondary | ICD-10-CM

## 2015-05-29 DIAGNOSIS — E785 Hyperlipidemia, unspecified: Secondary | ICD-10-CM

## 2015-05-29 DIAGNOSIS — Q211 Atrial septal defect: Secondary | ICD-10-CM

## 2015-05-29 DIAGNOSIS — Z008 Encounter for other general examination: Secondary | ICD-10-CM

## 2015-05-29 LAB — CARDIOLIPIN ANTIBODIES, IGG, IGM, IGA
Anticardiolipin IgA: <9 APL U/mL (ref 0–11)
Anticardiolipin IgM: <9 MPL U/mL (ref 0–12)

## 2015-05-29 LAB — GLUCOSE, CAPILLARY
GLUCOSE-CAPILLARY: 101 mg/dL — AB (ref 65–99)
GLUCOSE-CAPILLARY: 81 mg/dL (ref 65–99)
GLUCOSE-CAPILLARY: 97 mg/dL (ref 65–99)
Glucose-Capillary: 108 mg/dL — ABNORMAL HIGH (ref 65–99)

## 2015-05-29 LAB — CBC WITH DIFFERENTIAL/PLATELET
BASOS PCT: 0 %
Basophils Absolute: 0 10*3/uL (ref 0.0–0.1)
EOS ABS: 0.3 10*3/uL (ref 0.0–0.7)
EOS PCT: 4 %
HEMATOCRIT: 41.5 % (ref 39.0–52.0)
Hemoglobin: 13.4 g/dL (ref 13.0–17.0)
Lymphocytes Relative: 34 %
Lymphs Abs: 2.9 10*3/uL (ref 0.7–4.0)
MCH: 25.9 pg — ABNORMAL LOW (ref 26.0–34.0)
MCHC: 32.3 g/dL (ref 30.0–36.0)
MCV: 80.1 fL (ref 78.0–100.0)
MONO ABS: 0.7 10*3/uL (ref 0.1–1.0)
MONOS PCT: 8 %
NEUTROS ABS: 4.7 10*3/uL (ref 1.7–7.7)
Neutrophils Relative %: 54 %
PLATELETS: 292 10*3/uL (ref 150–400)
RBC: 5.18 MIL/uL (ref 4.22–5.81)
RDW: 13.8 % (ref 11.5–15.5)
WBC: 8.7 10*3/uL (ref 4.0–10.5)

## 2015-05-29 LAB — BASIC METABOLIC PANEL
Anion gap: 8 (ref 5–15)
BUN: 9 mg/dL (ref 6–20)
CALCIUM: 9 mg/dL (ref 8.9–10.3)
CO2: 27 mmol/L (ref 22–32)
CREATININE: 0.89 mg/dL (ref 0.61–1.24)
Chloride: 103 mmol/L (ref 101–111)
GLUCOSE: 96 mg/dL (ref 65–99)
Potassium: 3.9 mmol/L (ref 3.5–5.1)
Sodium: 138 mmol/L (ref 135–145)

## 2015-05-29 LAB — LUPUS ANTICOAGULANT PANEL
DRVVT: 22.4 s (ref 0.0–44.0)
PTT Lupus Anticoagulant: 34.9 s (ref 0.0–43.6)

## 2015-05-29 LAB — BETA-2-GLYCOPROTEIN I ABS, IGG/M/A

## 2015-05-29 LAB — HEMOGLOBIN A1C
HEMOGLOBIN A1C: 5.7 % — AB (ref 4.8–5.6)
MEAN PLASMA GLUCOSE: 117 mg/dL

## 2015-05-29 NOTE — Progress Notes (Signed)
STROKE TEAM PROGRESS NOTE   HISTORY OF PRESENT ILLNESS Austin Fuentes is a 35 y.o. male with No past medical history who presents with abnormal sensation of depth perception being off on his left side that started this morning. He states that he was last normal prior to bed last night and subsequently on awakening this morning he reached for his phone and felt like his "left arm felt like jelly." throughout the day he has been reaching for things and they have not been where he expected them to be. Finally, his wife convinced him to come in and be checked out and a head CT revealed a hypodensity in the right parietal region consistent with infarct.  He denies any recent illnesses other than slight cough for the past couple of weeks. He denies headaches, denies neck pain.  LKW: 3/29 prior to bed tpa given?: no, Out of window Premorbid modified rankin scale: 0   SUBJECTIVE (INTERVAL HISTORY) No family members present today. The patient is feeling better. Dr. Leonie Man discussed the plans for the TEE and loop placement on Monday. The patient is agreeable to proceeding.    OBJECTIVE Temp:  [97.4 F (36.3 C)-98.4 F (36.9 C)] 97.7 F (36.5 C) (04/01 1011) Pulse Rate:  [58-96] 96 (04/01 1011) Cardiac Rhythm:  [-] Normal sinus rhythm (04/01 0700) Resp:  [16-20] 16 (04/01 1011) BP: (103-137)/(64-84) 103/64 mmHg (04/01 1011) SpO2:  [94 %-100 %] 96 % (04/01 1011)  CBC:   Recent Labs Lab 05/27/15 2259 05/27/15 2307 05/29/15 0402  WBC 11.3*  --  8.7  NEUTROABS 6.6  --  4.7  HGB 14.1 15.3 13.4  HCT 43.6 45.0 41.5  MCV 79.6  --  80.1  PLT 340  --  735    Basic Metabolic Panel:   Recent Labs Lab 05/27/15 2259 05/27/15 2307 05/29/15 0402  NA 139 142 138  K 3.6 4.0 3.9  CL 104 102 103  CO2 26  --  27  GLUCOSE 120* 109* 96  BUN '9 13 9  ' CREATININE 0.98 1.00 0.89  CALCIUM 9.2  --  9.0    Lipid Panel:     Component Value Date/Time   CHOL 214* 05/28/2015 0551   TRIG 309*  05/28/2015 0551   HDL 29* 05/28/2015 0551   CHOLHDL 7.4 05/28/2015 0551   VLDL 62* 05/28/2015 0551   LDLCALC 123* 05/28/2015 0551   HgbA1c:  Lab Results  Component Value Date   HGBA1C 5.7* 05/28/2015   Urine Drug Screen: No results found for: LABOPIA, COCAINSCRNUR, LABBENZ, AMPHETMU, THCU, LABBARB    IMAGING I have personally reviewed the radiological images below and agree with the radiology interpretations.  Ct Angio Head and Neck  W/cm &/or Wo Cm 05/28/2015   Normal CTA of the head and neck.   Ct Head Wo Contrast 05/28/2015   Focal right parietal lobe infarct, likely subacute or acute. Correlation with clinical exam and further evaluation with MRI recommended. No acute intracranial hemorrhage.   Mr Kizzie Fantasia Contrast 05/28/2015   1. Acute ischemic nonhemorrhagic right parietal lobe infarcts without significant mass effect.  2. Additional subcentimeter cortical ischemic nonhemorrhagic left parietal lobe infarct.  3. Remote infarct involving the superior right cerebellar vermis.  4. Mild chronic small vessel ischemic disease.   2-D echocardiogram 05/28/2015 Study Conclusions - Left ventricle: The cavity size was normal. Systolic function was  normal. The estimated ejection fraction was in the range of 60%  to 65%. Wall motion was normal; there  were no regional wall  motion abnormalities. Left ventricular diastolic function  parameters were normal. - Aortic valve: There was no regurgitation. - Aortic root: The aortic root was normal in size. - Mitral valve: There was trivial regurgitation. - Right ventricle: The cavity size was normal. Wall thickness was  normal. Systolic function was normal. - Right atrium: The atrium was normal in size. - Tricuspid valve: There was no regurgitation. - Pulmonic valve: There was no regurgitation. - Pulmonary arteries: Systolic pressure was within the normal  range. - Inferior vena cava: The vessel was normal in size. -  Pericardium, extracardiac: There was no pericardial effusion. Impressions: - Normal study.    TCD bubble study - likely very small PFO  LE venous doppler - negative for DVT   PHYSICAL EXAM  Temp:  [97.4 F (36.3 C)-98.4 F (36.9 C)] 97.7 F (36.5 C) (04/01 1011) Pulse Rate:  [58-96] 96 (04/01 1011) Resp:  [16-20] 16 (04/01 1011) BP: (103-137)/(64-84) 103/64 mmHg (04/01 1011) SpO2:  [94 %-100 %] 96 % (04/01 1011)  General - Well nourished, well developed, in no apparent distress.  Ophthalmologic - Sharp disc margins OU.  Cardiovascular - Regular rate and rhythm with no murmur.  Mental Status -  Level of arousal and orientation to time, place, and person were intact. Language including expression, naming, repetition, comprehension was assessed and found intact. Fund of Knowledge was assessed and was intact.  Cranial Nerves II - XII - II - Visual field intact OU. III, IV, VI - Extraocular movements intact. V - Facial sensation intact bilaterally. VII - Facial movement intact bilaterally. VIII - Hearing & vestibular intact bilaterally. X - Palate elevates symmetrically. XI - Chin turning & shoulder shrug intact bilaterally. XII - Tongue protrusion intact.  Motor Strength - The patient's strength was normal in all extremities and pronator drift was absent.  Bulk was normal and fasciculations were absent.   Motor Tone - Muscle tone was assessed at the neck and appendages and was normal.  Reflexes - The patient's reflexes were 1+ in all extremities and he had no pathological reflexes.  Sensory - Light touch, temperature/pinprick, vibration and proprioception were assessed and were symmetrical.    Coordination - The patient had normal movements in the hands with no ataxia or dysmetria.  Tremor was absent.  Gait and Station - deferred.   ASSESSMENT/PLAN Mr. Austin Fuentes is a 35 y.o. male with no significant past medical history presenting with depth perception  abnormality and abnormal sensation left upper extremity. He did not receive IV t-PA due to late presentation and minimal deficits.  Strokes:  Bilateral infarcts probably embolic from an unknown source. 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   MRI -  Acute ischemic nonhemorrhagic right parietal lobe infarcts with subcentimeter cortical ischemic nonhemorrhagic left parietal lobe infarct. Remote infarct involving the superior right cerebellar vermis.  CTA head and neck - normal  Transcranial Dopplers - likely very small PFO  Lower extremity venous Dopplers - negative for DVT.   2D Echo EF 60-65%. Normal  TEE and possible loop recorder tentatively scheduled for Monday as lack of stroke etiology.  LDL 123  Hyper coagulable labs - pending. ESR 32 (H)  HgbA1c 5.7  VTE prophylaxis - Lovenox Diet Heart Room service appropriate?: Yes; Fluid consistency:: Thin  No antithrombotic prior to admission, now on aspirin 325 mg daily.   Patient counseled to be compliant with his antithrombotic medications  Ongoing aggressive stroke risk factor management  Therapy  recommendations: No f/u PT recommended. Outpatient speech therapy recommended for cognition.  Disposition: Pending  Likely very small PFO  TCD bubble study spencer degree I even with valsalva  Not sure the relation to current stroke  TEE pending  Hyperlipidemia  Home meds:  No lipid lowering medications prior to admission.  LDL 123, goal < 70  Now on Lipitor 80 mg daily  Continue statin at discharge  Other Stroke Risk Factors  Advanced age  Obesity, Body mass index is 47.15 kg/(m^2).   Hx of previous stroke - by MRI  S/s OSA - need outpt sleep study  Other Active Problems  Mild leukocytosis - resolved   Hospital day # [place] PA-C [facility] Pager [phone] 05/29/2015, 2:32 PM I have personally examined this patient, reviewed notes, independently viewed imaging studies,  participated in medical decision making and plan of care. I have made any additions or clarifications directly to the above note. Agree with note above.   Antony Contras, MD Medical Director [facility] Pager: [phone] 05/29/2015 2:57 PM    To contact Stroke Continuity provider, please refer to [email or web address] After hours, contact General Neurology

## 2015-05-29 NOTE — Progress Notes (Signed)
[facility] Progress Note  Patient: Austin Fuentes JYN:[id number]   PCP: Lanier EnsignSOLES, MEREDITH KEY, MD DOB: [birth year]   DOA: 05/27/2015   DOS: 05/29/2015   Date of Service: the patient was seen and examined on 05/29/2015  Subjective: Patient denies having any acute complaint at present Nutrition: Tolerating oral diet  Brief hospital course: Patient was admitted on 05/27/2015, with complaint of left-sided weakness, was found to have acute cerebral ischemic infarct. Currently further plan is TEE and loop recorder on Monday.  Assessment and Plan: 1. CVA (cerebral vascular accident) (HCC) Acute ischemic infarct on the right parietal lobe as well as left parietal lobe Echocardiogram shows ejection traction 06-65% without any wall motion abnormality or major valvular dysfunction. Lower leg Doppler is negative. A carotid Doppler shows no significant evidence of stenosis. Dyslipidemia is present. Hypercoagulable workup is currently pending. TEE and loop recorder is scheduled on Monday. There is evidence of possible PFO on transcranial ultrasound. Currently on aspirin. Lipitor 80 mg as well.  2. Morbid obesity. Suspected sleep apnea. Patient will need a sleep evaluation on discharge. Recommend neurology to follow-up in order as an outpatient.  Activity: physical therapy outpatient OT and PT is recommended  Bowel regimen: last BM 05/28/2015 DVT Prophylaxis: subcutaneous Heparin Nutrition: Cardiac diet Advance goals of care discussion: full code  HPI: As per the H and P dictated on admission, "Austin Fuentes is a 35 y.o. male with PMH of seasonal allergy and plantar fasciitis who presents to the ED with numbness to the left side of his body and visual disturbance first noted at approximately 6:45 AM on 05/27/2015. Patient takes no prescription medications and typically enjoys good health. He went to bed in his usual state the night of 05/26/2015, and upon waking the following morning he noted  his left arm to be numb. He initially thought that he must have slept on the arm, but soon realized that his left leg and face also had paresthesia. He reports that his visual acuity is intact, but he perceives a problem with his depth perception, bumping into things that he thought he was walking past. 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 He experienced some difficulty putting money in the appropriate compartment in the cash register and noted some mild cognitive slowing while at work. Eventually, upon his wife's insistence, he came into the ED for evaluation. He has never experienced similar symptoms previously and denies any personal or family history of VTE or premature heart attack. He has never smoked, does not drink alcohol, and denies use of illicit drugs. There's been no unilateral leg swelling or tenderness and he has never experienced palpitations. He reports that his symptoms are unchanged from time of onset." Procedures: Echocardiogram, bilateral lower extremity Doppler venous, bilateral carotid Doppler, transcranial Doppler  Consultants: neurology, cardiology  Antibiotics: Anti-infectives    None      Family Communication: no family was present at bedside, at the time of interview.   Disposition:  Expected discharge date: 06/01/2015  Barriers to safe discharge: TEE on Monday   No intake or output data in the 24 hours ending 05/29/15 1733 Filed Weights   05/28/15 0304  Weight: 132.45 kg (292 lb)    Objective: Physical Exam: Filed Vitals:   05/29/15 0128 05/29/15 0533 05/29/15 1011 05/29/15 1440  BP: 111/65 112/73 103/64 101/55  Pulse: 65 73 96 75  Temp:  97.7 F (36.5 C) 97.5 F (36.4 C) 97.7 F (36.5 C) 97.8 F (36.6 C)  TempSrc: Oral Oral Oral Oral  Resp: Height:      Weight:      SpO2: 94% 96% 96% 97%     General: Appear in  no distress, no Rash; Oral Mucosa moist. Cardiovascular: S1 and S2 Present, no Murmur, no JVD Respiratory: Bilateral Air entry present and Clear to Auscultation, no Crackles, no wheezes Abdomen: Bowel Sound present, Soft and no tenderness Extremities: no Pedal edema, no calf tenderness Neurology: Grossly no focal neuro deficit.  Data Reviewed: CBC:  Recent Labs Lab 05/27/15 2259 05/27/15 2307 05/29/15 0402  WBC 11.3*  --  8.7  NEUTROABS 6.6  --  4.7  HGB 14.1 15.3 13.4  HCT 43.6 45.0 41.5  MCV 79.6  --  80.1  PLT 340  --  292   Basic Metabolic Panel:  Recent Labs Lab 05/27/15 2259 05/27/15 2307 05/29/15 0402  NA 139 142 138  K 3.6 4.0 3.9  CL 104 102 103  CO2 26  --  27  GLUCOSE 120* 109* 96  BUN CREATININE 0.98 1.00 0.89  CALCIUM 9.2  --  9.0   Liver Function Tests:  Recent Labs Lab 05/27/15 2259  AST 25  ALT 26  ALKPHOS 64  BILITOT 0.6  PROT 7.0  ALBUMIN 3.8   No results for input(s): LIPASE, AMYLASE in the last 168 hours. No results for input(s): AMMONIA in the last 168 hours.  Cardiac Enzymes:  Recent Labs Lab 05/27/15 2259  TROPONINI <0.03    BNP (last 3 results) No results for input(s): BNP in the last 8760 hours.  CBG:  Recent Labs Lab 05/28/15 1630 05/28/15 2136 05/29/15 0629 05/29/15 1115 05/29/15 1613  GLUCAP 105* 107* 108* 101* 81    No results found for this or any previous visit (from the past 240 hour(s)).   Studies: No results found.   Scheduled Meds: . aspirin  300 mg Rectal Daily   Or  . aspirin  325 mg Oral Daily  . atorvastatin  80 mg Oral q1800  . enoxaparin (LOVENOX) injection  40 mg Subcutaneous Daily   Continuous Infusions:  PRN Meds: senna-docusate  Time spent: 30 minutes  Author: Lynden Oxford, MD [facility] Pager: [phone] 05/29/2015 5:33 PM  If 7PM-7AM, please contact night-coverage at [email or web address], password [facility]

## 2015-05-29 NOTE — Evaluation (Signed)
Physical Therapy Evaluation Patient Details Name: Austin Fuentes MRN: [id number] DOB: 1980/12/03 Today's Date: 05/29/2015   History of Present Illness  Pt is a 35 y.o. male admitted with R parietal infarct.  Clinical Impression  PT eval complete. Pt is independent with all functional mobility, including stairs. He scored 23/24 on DGI. His only c/o is depth perception on left. He had no issues with running into walls or obstacles during eval. No acute PT intervention or follow up services indicated. PT signing off.    Follow Up Recommendations No PT follow up    Equipment Recommendations  None recommended by PT    Recommendations for Other Services       Precautions / Restrictions Precautions Precautions: None      Mobility  Bed Mobility Overal bed mobility: Independent                Transfers Overall transfer level: Independent Equipment used: None                Ambulation/Gait Ambulation/Gait assistance: Independent Ambulation Distance (Feet): 300 Feet Assistive device: None Gait Pattern/deviations: WFL(Within Functional Limits)   Gait velocity interpretation: at or above normal speed for age/gender    Stairs Stairs: Yes Stairs assistance: Independent Stair Management: No rails;Forwards;Alternating pattern Number of Stairs: 10    Wheelchair Mobility    Modified Rankin (Stroke Patients Only) Modified Rankin (Stroke Patients Only) Pre-Morbid Rankin Score: No symptoms Modified Rankin: No significant disability     Balance                                 Standardized Balance Assessment Standardized Balance Assessment : Dynamic Gait Index   Dynamic Gait Index Level Surface: Normal Change in Gait Speed: Normal Gait with Horizontal Head Turns: Normal Gait with Vertical Head Turns: Normal Gait and Pivot Turn: Normal Step Over Obstacle: Mild Impairment Step Around Obstacles: Normal Steps: Normal Total Score: 23        Pertinent Vitals/Pain Pain Assessment: No/denies pain    Home Living Family/patient expects to be discharged to:: Private residence Living Arrangements: Spouse/significant other;Children Available Help at Discharge: Family;Available 24 hours/day Type of Home: House Home Access: Stairs to enter Entrance Stairs-Rails: Right;Left;Can reach both Entrance Stairs-Number of Steps: 3 Home Layout: Two level;Able to live on main level with bedroom/bathroom Home Equipment: None      Prior Function Level of Independence: Independent         Comments: drives, employed as a Conservation officer, nature.     Hand Dominance        Extremity/Trunk Assessment   Upper Extremity Assessment: Defer to OT evaluation           Lower Extremity Assessment: LLE deficits/detail   LLE Deficits / Details: strength appears symmetrical R/L  Cervical / Trunk Assessment: Normal  Communication   Communication: No difficulties  Cognition Arousal/Alertness: Awake/alert Behavior During Therapy: WFL for tasks assessed/performed Overall Cognitive Status: Impaired/Different from baseline Area of Impairment: Problem solving             Problem Solving: Difficulty sequencing General Comments: difficulty with high level cognitive activities per SLP    General Comments      Exercises        Assessment/Plan    PT Assessment Patent does not need any further PT services  PT Diagnosis Abnormality of gait   PT Problem List    PT Treatment Interventions  PT Goals (Current goals can be found in the Care Plan section) Acute Rehab PT Goals Patient Stated Goal: home PT Goal Formulation: All assessment and education complete, DC therapy    Frequency     Barriers to discharge        Co-evaluation               End of Session   Activity Tolerance: Patient tolerated treatment well Patient left: in bed;with call bell/phone within reach Nurse Communication: Mobility status         Time:  [id number] PT Time Calculation (min) (ACUTE ONLY): 13 min   Charges:   PT Evaluation $PT Eval Moderate Complexity: 1 Procedure     PT G Codes:        Ilda FoilGarrow, Norah Fick Rene 05/29/2015, 2:22 PM

## 2015-05-29 NOTE — Evaluation (Signed)
Speech Language Pathology Evaluation Patient Details Name: Adela PortsJason T Reek MRN: [id number] DOB: 14-Sep-1980 Today's Date: 05/29/2015 Time: [id number] SLP Time Calculation (min) (ACUTE ONLY): 25 min  Problem List:  Patient Active Problem List   Diagnosis Date Noted  . CVA (cerebral vascular accident) (HCC) 05/28/2015  . Acute ischemic stroke (HCC) 05/28/2015  . Needs sleep apnea assessment 05/28/2015  . Severe obesity (BMI >= 40) (HCC) 05/28/2015  . Hyperlipidemia 05/28/2015  . Cerebral infarction due to unspecified mechanism   . PFO (patent foramen ovale)   . History of stroke    Past Medical History:  Past Medical History  Diagnosis Date  . Allergy    Past Surgical History:  Past Surgical History  Procedure Laterality Date  . Tonsillectomy     HPI:  35 y.o. male with no significant past medical history presenting with depth perception abnormality, abnormal sensation LUE, and slowed cognitive processing. MRI shows bilateral acute parietal lobe infarcts as well as remote infarct involving the superior right cerebellar vermis.   Assessment / Plan / Recommendation Clinical Impression  Pt scored a 26/30 on the MoCA which falls within the range of functional limits; however, he also describes difficulties with higher level cognitive processing that he experienced while at work after onset of other symptoms, including slower processing and difficulties with complex problem solving. Mild deficits noted with recall of new information, recalling 2/4 words after 5 minutes and needing multiple choice options to recall the remaining two. Given his young age, level of independence, and amount of responsibilities, recommend at least brief SLP f/u to address higher level processing skills.    SLP Assessment  Patient needs continued Speech Lanaguage Pathology Services    Follow Up Recommendations  24 hour supervision/assistance;Outpatient SLP    Frequency and Duration min 1 x/week  One  additional visit      SLP Evaluation Prior Functioning  Cognitive/Linguistic Baseline: Within functional limits  Lives With: Family (wife and young children) Available Help at Discharge: Family Vocation: Full time employment   Cognition  Overall Cognitive Status: Impaired/Different from baseline Arousal/Alertness: Awake/alert Orientation Level: Oriented X4 Attention: Selective Selective Attention: Appears intact Memory: Impaired Memory Impairment: Retrieval deficit;Decreased recall of new information Awareness: Appears intact Problem Solving: Impaired Problem Solving Impairment: Functional complex Safety/Judgment: Appears intact    Comprehension  Auditory Comprehension Overall Auditory Comprehension: Appears within functional limits for tasks assessed    Expression Expression Primary Mode of Expression: Verbal Verbal Expression Overall Verbal Expression: Appears within functional limits for tasks assessed   Oral / Motor  Oral Motor/Sensory Function Overall Oral Motor/Sensory Function:  (appears WFL during functional tasks) Motor Speech Overall Motor Speech: Appears within functional limits for tasks assessed   GO                   Maxcine HamLaura Paiewonsky, M.A. CCC-SLP [phone]  Maxcine Hamaiewonsky, Theresia Pree 05/29/2015, 10:28 AM

## 2015-05-29 NOTE — Progress Notes (Signed)
Occupational Therapy Evaluation/Discharge Patient Details Name: Austin Fuentes MRN: [id number] DOB: [birth year] Today's Date: 05/29/2015    History of Present Illness Pt is a 35 y.o. male admitted with R parietal infarct.   Clinical Impression   Patient has been evaluated by Occupational Therapy with no acute OT needs identified. Pt completed all ADLs and mobility with modified independence. Pt presents with decreased coordination and depth perception of LUE and also reports difficulty with higher level cognitive tasks related to attention skills. Pt plans to d/c home with intermittent assistance from his wife. Reviewed signs and symptoms of stroke and progression of rehab recommendations. All education has been completed and pt has no further questions. Recommend neuro OP OT to address coordination and vision deficits. OT signing off.    Follow Up Recommendations  Outpatient OT;Supervision - Intermittent    Equipment Recommendations  None recommended by OT    Recommendations for Other Services       Precautions / Restrictions Precautions Precautions: None Restrictions Weight Bearing Restrictions: No      Mobility Bed Mobility Overal bed mobility: Modified Independent                Transfers Overall transfer level: Modified independent Equipment used: None                  Balance                                 Standardized Balance Assessment Standardized Balance Assessment : Dynamic Gait Index   Dynamic Gait Index Level Surface: Normal Change in Gait Speed: Normal Gait with Horizontal Head Turns: Normal Gait with Vertical Head Turns: Normal Gait and Pivot Turn: Normal Step Over Obstacle: Mild Impairment Step Around Obstacles: Normal Steps: Normal Total Score: 23      ADL Overall ADL's : Modified independent                                       General ADL Comments: No difficulty with sequencing through ADL  tasks.      Vision Vision Assessment?: Yes Eye Alignment: Within Functional Limits Ocular Range of Motion: Within Functional Limits Alignment/Gaze Preference: Within Defined Limits Tracking/Visual Pursuits: Able to track stimulus in all quads without difficulty Saccades: Within functional limits Convergence: Within functional limits Visual Fields: No apparent deficits Depth Perception: Undershoots (with L hand only)   Perception Perception Perception Tested?: Yes Perception Deficits: Inattention/neglect Inattention/Neglect: Appears intact   Praxis Praxis Praxis tested?: Within functional limits    Pertinent Vitals/Pain Pain Assessment: No/denies pain     Hand Dominance Left   Extremity/Trunk Assessment Upper Extremity Assessment Upper Extremity Assessment: LUE deficits/detail LUE Deficits / Details: Strength, AROM and sensation all wnl; pt missing target for depth perception and coordinatino tasks with this arm only LUE Coordination: decreased gross motor;decreased fine motor   Lower Extremity Assessment Lower Extremity Assessment: Overall WFL for tasks assessed LLE Deficits / Details: strength appears symmetrical R/L LLE Sensation: decreased light touch   Cervical / Trunk Assessment Cervical / Trunk Assessment: Normal   Communication Communication Communication: No difficulties   Cognition Arousal/Alertness: Awake/alert Behavior During Therapy: WFL for tasks assessed/performed Overall Cognitive Status: Within Functional Limits for tasks assessed Area of Impairment: Problem solving             Problem Solving: Difficulty sequencing  General Comments: Pt reports difficulty with reading, writing, and depth perception   General Comments       Exercises       Shoulder Instructions      Home Living Family/patient expects to be discharged to:: Private residence Living Arrangements: Spouse/significant other;Children Available Help at Discharge:  Family;Available PRN/intermittently (wife works during day) Type of Home: House Home Access: Stairs to enter [employer] of Steps: 3 Entrance Stairs-Rails: Right;Left;Can reach both Home Layout: Two level;Able to live on main level with bedroom/bathroom Alternate Level Stairs-Number of Steps: flight   Bathroom Shower/Tub: Producer, television/film/video: Handicapped height     Home Equipment: Grab bars - tub/shower;Hand held shower head      Lives With: Family    Prior Functioning/Environment Level of Independence: Independent        Comments: works as an Geneticist, molecular at [employer]    OT Diagnosis: Disturbance of vision;Other (comment) (LUE incoordination)   OT Problem List: Impaired vision/perception;Decreased coordination;Decreased cognition;Decreased safety awareness;Impaired UE functional use   OT Treatment/Interventions:      OT Goals(Current goals can be found in the care plan section) Acute Rehab OT Goals Patient Stated Goal: to go home OT Goal Formulation: With patient Time For Goal Achievement: 06/12/15 Potential to Achieve Goals: Good  OT Frequency:     Barriers to D/C:            Co-evaluation              End of Session Equipment Utilized During Treatment: Gait belt Nurse Communication: Mobility status  Activity Tolerance: Patient tolerated treatment well Patient left: in bed;with call bell/phone within reach   Time: 4098-1191 OT Time Calculation (min): 17 min Charges:  OT General Charges $OT Visit: 1 Procedure OT Evaluation $OT Eval Low Complexity: 1 Procedure G-Codes:    Austin Fuentes, OTR/L Pager: [phone] 05/29/2015, 5:16 PM

## 2015-05-30 DIAGNOSIS — I6349 Cerebral infarction due to embolism of other cerebral artery: Secondary | ICD-10-CM

## 2015-05-30 LAB — GLUCOSE, CAPILLARY
GLUCOSE-CAPILLARY: 92 mg/dL (ref 65–99)
GLUCOSE-CAPILLARY: 91 mg/dL (ref 65–99)
Glucose-Capillary: 79 mg/dL (ref 65–99)

## 2015-05-30 NOTE — Progress Notes (Signed)
STROKE TEAM PROGRESS NOTE   HISTORY OF PRESENT ILLNESS Austin Fuentes is a 35 y.o. male with No past medical history who presents with abnormal sensation of depth perception being off on his left side that started this morning. He states that he was last normal prior to bed last night and subsequently on awakening this morning he reached for his phone and felt like his "left arm felt like jelly." throughout the day he has been reaching for things and they have not been where he expected them to be. Finally, his wife convinced him to come in and be checked out and a head CT revealed a hypodensity in the right parietal region consistent with infarct.  He denies any recent illnesses other than slight cough for the past couple of weeks. He denies headaches, denies neck pain.  LKW: 3/29 prior to bed tpa given?: no, Out of window Premorbid modified rankin scale: 0   SUBJECTIVE (INTERVAL HISTORY) No family members present today. The patient is feeling better. Dr. Leonie Man discussed the plans for the TEE and loop placement on Monday. The patient is agreeable to proceeding. Still having problems with depth perception.   OBJECTIVE Temp:  [97.3 F (36.3 C)-97.9 F (36.6 C)] 97.3 F (36.3 C) (04/02 1051) Pulse Rate:  [64-92] 81 (04/02 1051) Cardiac Rhythm:  [-] Normal sinus rhythm (04/02 0700) Resp:  [15-18] 15 (04/02 1051) BP: (99-110)/(53-73) 101/69 mmHg (04/02 1051) SpO2:  [95 %-97 %] 95 % (04/02 1051)  CBC:   Recent Labs Lab 05/27/15 2259 05/27/15 2307 05/29/15 0402  WBC 11.3*  --  8.7  NEUTROABS 6.6  --  4.7  HGB 14.1 15.3 13.4  HCT 43.6 45.0 41.5  MCV 79.6  --  80.1  PLT 340  --  662    Basic Metabolic Panel:   Recent Labs Lab 05/27/15 2259 05/27/15 2307 05/29/15 0402  NA 139 142 138  K 3.6 4.0 3.9  CL 104 102 103  CO2 26  --  27  GLUCOSE 120* 109* 96  BUN '9 13 9  ' CREATININE 0.98 1.00 0.89  CALCIUM 9.2  --  9.0    Lipid Panel:     Component Value Date/Time   CHOL 214* 05/28/2015 0551   TRIG 309* 05/28/2015 0551   HDL 29* 05/28/2015 0551   CHOLHDL 7.4 05/28/2015 0551   VLDL 62* 05/28/2015 0551   LDLCALC 123* 05/28/2015 0551   HgbA1c:  Lab Results  Component Value Date   HGBA1C 5.7* 05/28/2015   Urine Drug Screen: No results found for: LABOPIA, COCAINSCRNUR, LABBENZ, AMPHETMU, THCU, LABBARB    IMAGING I have personally reviewed the radiological images below and agree with the radiology interpretations.  Ct Angio Head and Neck  W/cm &/or Wo Cm 05/28/2015   Normal CTA of the head and neck.   Ct Head Wo Contrast 05/28/2015   Focal right parietal lobe infarct, likely subacute or acute. Correlation with clinical exam and further evaluation with MRI recommended. No acute intracranial hemorrhage.   Mr Kizzie Fantasia Contrast 05/28/2015   1. Acute ischemic nonhemorrhagic right parietal lobe infarcts without significant mass effect.  2. Additional subcentimeter cortical ischemic nonhemorrhagic left parietal lobe infarct.  3. Remote infarct involving the superior right cerebellar vermis.  4. Mild chronic small vessel ischemic disease.   2-D echocardiogram 05/28/2015 Study Conclusions - Left ventricle: The cavity size was normal. Systolic function was  normal. The estimated ejection fraction was in the range of 60%  to 65%. Wall  motion was normal; there were no regional wall  motion abnormalities. Left ventricular diastolic function  parameters were normal. - Aortic valve: There was no regurgitation. - Aortic root: The aortic root was normal in size. - Mitral valve: There was trivial regurgitation. - Right ventricle: The cavity size was normal. Wall thickness was  normal. Systolic function was normal. - Right atrium: The atrium was normal in size. - Tricuspid valve: There was no regurgitation. - Pulmonic valve: There was no regurgitation. - Pulmonary arteries: Systolic pressure was within the normal  range. - Inferior vena cava: The  vessel was normal in size. - Pericardium, extracardiac: There was no pericardial effusion. Impressions: - Normal study.    TCD bubble study - likely very small PFO  LE venous doppler - negative for DVT   PHYSICAL EXAM  Temp:  [97.3 F (36.3 C)-97.9 F (36.6 C)] 97.3 F (36.3 C) (04/02 1051) Pulse Rate:  [64-92] 81 (04/02 1051) Resp:  [15-18] 15 (04/02 1051) BP: (99-110)/(53-73) 101/69 mmHg (04/02 1051) SpO2:  [95 %-97 %] 95 % (04/02 1051)  General - Well nourished, well developed, in no apparent distress.  Ophthalmologic - Sharp disc margins OU.  Cardiovascular - Regular rate and rhythm with no murmur.  Mental Status -  Level of arousal and orientation to time, place, and person were intact. Language including expression, naming, repetition, comprehension was assessed and found intact. Fund of Knowledge was assessed and was intact.  Cranial Nerves II - XII - II - Visual field intact OU. III, IV, VI - Extraocular movements intact. V - Facial sensation intact bilaterally. VII - Facial movement intact bilaterally. VIII - Hearing & vestibular intact bilaterally. X - Palate elevates symmetrically. XI - Chin turning & shoulder shrug intact bilaterally. XII - Tongue protrusion intact.  Motor Strength - The patient's strength was normal in all extremities and pronator drift was absent.  Bulk was normal and fasciculations were absent.   Motor Tone - Muscle tone was assessed at the neck and appendages and was normal.  Reflexes - The patient's reflexes were 1+ in all extremities and he had no pathological reflexes.  Sensory - Light touch, temperature/pinprick, vibration and proprioception were assessed and were symmetrical.    Coordination - The patient had normal movements in the hands with no ataxia or dysmetria.  Tremor was absent.  Gait and Station - deferred.   ASSESSMENT/PLAN Mr. Austin Fuentes is a 35 y.o. male with no significant past medical history presenting  with depth perception abnormality and abnormal sensation left upper extremity. He did not receive IV t-PA due to late presentation and minimal deficits.  Strokes:  Bilateral infarcts probably embolic from an unknown source. 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   MRI -  Acute ischemic nonhemorrhagic right parietal lobe infarcts with subcentimeter cortical ischemic nonhemorrhagic left parietal lobe infarct. Remote infarct involving the superior right cerebellar vermis.  CTA head and neck - normal  Transcranial Dopplers - likely very small PFO  Lower extremity venous Dopplers - negative for DVT.   2D Echo EF 60-65%. Normal  TEE and possible loop recorder tentatively scheduled for Monday as lack of stroke etiology.  LDL 123  Hyper coagulable labs - pending. ESR 32 (H)  HgbA1c 5.7  VTE prophylaxis - Lovenox Diet Heart Room service appropriate?: Yes; Fluid consistency:: Thin  No antithrombotic prior to admission, now on aspirin 325 mg daily.   Patient counseled to be compliant with his antithrombotic medications  Ongoing aggressive stroke risk  factor management  Therapy recommendations: No f/u PT recommended. Outpatient speech therapy recommended for cognition.  Disposition: Pending  Likely very small PFO  TCD bubble study spencer degree I even with valsalva  Not sure the relation to current stroke  TEE pending  Hyperlipidemia  Home meds:  No lipid lowering medications prior to admission.  LDL 123, goal < 70  Now on Lipitor 80 mg daily  Continue statin at discharge  Other Stroke Risk Factors  Advanced age  Obesity, Body mass index is 47.15 kg/(m^2).   Hx of previous stroke - by MRI  S/s OSA - need outpt sleep study  Other Active Problems  Mild leukocytosis, febrile, will order an infectious workup    Personally examined patient and images, and have participated in and made any corrections needed to history, physical, neuro exam,assessment and plan as  stated above.  I have personally obtained the history, evaluated lab date, reviewed imaging studies and agree with radiology interpretations.    Sarina Ill, MD Stroke Neurology [phone] [facility]      To contact Stroke Continuity provider, please refer to [email or web address] After hours, contact General Neurology

## 2015-05-30 NOTE — Progress Notes (Signed)
[facility] Progress Note  Patient: Austin Fuentes ZOX:[id number]   PCP: Lanier Ensign KEY, MD DOB: 1980-03-27   DOA: 05/27/2015   DOS: 05/30/2015   Date of Service: the patient was seen and examined on 05/30/2015  Subjective: No acute complaint no new weakness or deficit. Nutrition: Tolerating oral diet  Brief hospital course: Patient was admitted on 05/27/2015, with complaint of left-sided weakness, was found to have acute cerebral ischemic infarct. Currently further plan is TEE and loop recorder on Monday.  Assessment and Plan: 1. CVA (cerebral vascular accident) (HCC) Acute ischemic infarct on the right parietal lobe as well as left parietal lobe Echocardiogram shows ejection traction 06-65% without any wall motion abnormality or major valvular dysfunction. Lower leg Doppler is negative. A carotid Doppler shows no significant evidence of stenosis. Dyslipidemia is present. Hypercoagulable workup is currently pending. TEE and loop recorder is scheduled on Monday. There is evidence of possible PFO on transcranial ultrasound. Currently on aspirin. Lipitor 80 mg as well.  2. Morbid obesity. Suspected sleep apnea. Patient will need a sleep evaluation on discharge. Recommend neurology to follow-up in order as an outpatient.  3. Hypercoagulable workup is so far negative.  Activity: physical therapy outpatient OT and PT is recommended  Bowel regimen: last BM 05/28/2015 DVT Prophylaxis: subcutaneous Heparin Nutrition: Cardiac diet Advance goals of care discussion: full code  HPI: As per the H and P dictated on admission, "Austin Fuentes is a 35 y.o. male with PMH of seasonal allergy and plantar fasciitis who presents to the ED with numbness to the left side of his body and visual disturbance first noted at approximately 6:45 AM on 05/27/2015. Patient takes no prescription medications and typically enjoys good health. He went to bed in his usual state the night of 05/26/2015, and  upon waking the following morning he noted his left arm to be numb. He initially thought that he must have slept on the arm, but soon realized that his left leg and face also had paresthesia. He reports that his visual acuity is intact, but he perceives a problem with his depth perception, bumping into things that he thought he was walking past. 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 He experienced some difficulty putting money in the appropriate compartment in the cash register and noted some mild cognitive slowing while at work. Eventually, upon his wife's insistence, he came into the ED for evaluation. He has never experienced similar symptoms previously and denies any personal or family history of VTE or premature heart attack. He has never smoked, does not drink alcohol, and denies use of illicit drugs. There's been no unilateral leg swelling or tenderness and he has never experienced palpitations. He reports that his symptoms are unchanged from time of onset." Procedures: Echocardiogram, bilateral lower extremity Doppler venous, bilateral carotid Doppler, transcranial Doppler  Consultants: neurology, cardiology  Antibiotics: Anti-infectives    None      Family Communication: no family was present at bedside, at the time of interview.   Disposition:  Expected discharge date: 06/01/2015  Barriers to safe discharge: TEE on Monday   No intake or output data in the 24 hours ending 05/30/15 1337 Filed Weights   05/28/15 0304  Weight: 132.45 kg (292 lb)    Objective: Physical Exam: Filed Vitals:   05/29/15 2154 05/30/15 0141 05/30/15 0500 05/30/15 1051  BP: 106/72 99/53 110/68 101/69  Pulse: 91 92 73 81  Temp: 97.9 F (36.6 C) 97.4 F (36.3 C) 97.5 F (36.4 C) 97.3 F (36.3 C)  TempSrc: Oral Oral Oral Oral  Resp: 18 18 18 15   Height:      Weight:      SpO2:  97% 95% 96% 95%     General: Appear in no distress, no Rash; Oral Mucosa moist. Cardiovascular: S1 and S2 Present, no Murmur, no JVD Respiratory: Bilateral Air entry present and Clear to Auscultation, no Crackles, no wheezes Abdomen: Bowel Sound present, Soft and no tenderness Extremities: no Pedal edema, no calf tenderness Neurology: Grossly no focal neuro deficit.  Data Reviewed: CBC:  Recent Labs Lab 05/27/15 2259 05/27/15 2307 05/29/15 0402  WBC 11.3*  --  8.7  NEUTROABS 6.6  --  4.7  HGB 14.1 15.3 13.4  HCT 43.6 45.0 41.5  MCV 79.6  --  80.1  PLT 340  --  292   Basic Metabolic Panel:  Recent Labs Lab 05/27/15 2259 05/27/15 2307 05/29/15 0402  NA 139 142 138  K 3.6 4.0 3.9  CL 104 102 103  CO2 26  --  27  GLUCOSE 120* 109* 96  BUN 9 13 9   CREATININE 0.98 1.00 0.89  CALCIUM 9.2  --  9.0   Liver Function Tests:  Recent Labs Lab 05/27/15 2259  AST 25  ALT 26  ALKPHOS 64  BILITOT 0.6  PROT 7.0  ALBUMIN 3.8   No results for input(s): LIPASE, AMYLASE in the last 168 hours. No results for input(s): AMMONIA in the last 168 hours.  Cardiac Enzymes:  Recent Labs Lab 05/27/15 2259  TROPONINI <0.03    BNP (last 3 results) No results for input(s): BNP in the last 8760 hours.  CBG:  Recent Labs Lab 05/29/15 1115 05/29/15 1613 05/29/15 2156 05/30/15 0637 05/30/15 1125  GLUCAP 101* 81 97 92 91    No results found for this or any previous visit (from the past 240 hour(s)).   Studies: No results found.   Scheduled Meds: . aspirin  300 mg Rectal Daily   Or  . aspirin  325 mg Oral Daily  . atorvastatin  80 mg Oral q1800  . enoxaparin (LOVENOX) injection  40 mg Subcutaneous Daily   Continuous Infusions:  PRN Meds: senna-docusate  Time spent: 30 minutes  Author: Lynden OxfordPranav Evan Mackie, MD [facility] Pager: [phone] 05/30/2015 1:37 PM  If 7PM-7AM, please contact night-coverage at [email or web address], password [email or web address]

## 2015-05-31 ENCOUNTER — Inpatient Hospital Stay (HOSPITAL_COMMUNITY): Payer: BLUE CROSS/BLUE SHIELD

## 2015-05-31 ENCOUNTER — Encounter (HOSPITAL_COMMUNITY)
Admission: EM | Disposition: A | Discharge status: Home / Self Care | Payer: Self-pay | Source: Home / Self Care | Attending: Internal Medicine

## 2015-05-31 ENCOUNTER — Encounter (HOSPITAL_COMMUNITY): Payer: Self-pay | Admitting: *Deleted

## 2015-05-31 DIAGNOSIS — I639 Cerebral infarction, unspecified: Secondary | ICD-10-CM

## 2015-05-31 DIAGNOSIS — Q211 Atrial septal defect: Secondary | ICD-10-CM | POA: Diagnosis present

## 2015-05-31 DIAGNOSIS — Q2111 Secundum atrial septal defect: Secondary | ICD-10-CM | POA: Diagnosis present

## 2015-05-31 HISTORY — PX: EP IMPLANTABLE DEVICE: SHX172B

## 2015-05-31 HISTORY — PX: TEE WITHOUT CARDIOVERSION: SHX5443

## 2015-05-31 LAB — BASIC METABOLIC PANEL
ANION GAP: 7 (ref 5–15)
BUN: 10 mg/dL (ref 6–20)
CO2: 27 mmol/L (ref 22–32)
Calcium: 9 mg/dL (ref 8.9–10.3)
Chloride: 105 mmol/L (ref 101–111)
Creatinine, Ser: 0.89 mg/dL (ref 0.61–1.24)
GLUCOSE: 94 mg/dL (ref 65–99)
POTASSIUM: 4.1 mmol/L (ref 3.5–5.1)
Sodium: 139 mmol/L (ref 135–145)

## 2015-05-31 LAB — GLUCOSE, CAPILLARY
GLUCOSE-CAPILLARY: 99 mg/dL (ref 65–99)
GLUCOSE-CAPILLARY: 99 mg/dL (ref 65–99)
GLUCOSE-CAPILLARY: 114 mg/dL — AB (ref 65–99)
Glucose-Capillary: 95 mg/dL (ref 65–99)
Glucose-Capillary: 123 mg/dL — ABNORMAL HIGH (ref 65–99)

## 2015-05-31 LAB — CBC
HEMATOCRIT: 42.2 % (ref 39.0–52.0)
HEMOGLOBIN: 13.8 g/dL (ref 13.0–17.0)
MCH: 26 pg (ref 26.0–34.0)
MCHC: 32.7 g/dL (ref 30.0–36.0)
MCV: 79.5 fL (ref 78.0–100.0)
Platelets: 293 10*3/uL (ref 150–400)
RBC: 5.31 MIL/uL (ref 4.22–5.81)
RDW: 13.6 % (ref 11.5–15.5)
WBC: 9.3 10*3/uL (ref 4.0–10.5)

## 2015-05-31 LAB — HOMOCYSTEINE: HOMOCYSTEINE-NORM: 9.6 umol/L (ref 0.0–15.0)

## 2015-05-31 LAB — ANTINUCLEAR ANTIBODIES, IFA: ANA Ab, IFA: NEGATIVE

## 2015-05-31 SURGERY — ECHOCARDIOGRAM, TRANSESOPHAGEAL
Anesthesia: Moderate Sedation

## 2015-05-31 SURGERY — LOOP RECORDER INSERTION

## 2015-05-31 MED ORDER — MIDAZOLAM HCL 10 MG/2ML IJ SOLN
INTRAMUSCULAR | Status: DC | PRN
  Administered 2015-05-31 (×2): 2 mg via INTRAVENOUS

## 2015-05-31 MED ORDER — LIDOCAINE-EPINEPHRINE 1 %-1:100000 IJ SOLN
INTRAMUSCULAR | Status: DC | PRN
  Administered 2015-05-31: 15 mL

## 2015-05-31 MED ORDER — LIDOCAINE-EPINEPHRINE 1 %-1:100000 IJ SOLN
INTRAMUSCULAR | Status: AC
  Filled 2015-05-31: qty 1

## 2015-05-31 MED ORDER — FENTANYL CITRATE (PF) 100 MCG/2ML IJ SOLN
INTRAMUSCULAR | Status: AC
  Filled 2015-05-31: qty 4

## 2015-05-31 MED ORDER — BUTAMBEN-TETRACAINE-BENZOCAINE 2-2-14 % EX AERO
INHALATION_SPRAY | CUTANEOUS | Status: DC | PRN
  Administered 2015-05-31: 2 via TOPICAL

## 2015-05-31 MED ORDER — ACETAMINOPHEN 325 MG PO TABS
650.0000 mg | ORAL_TABLET | ORAL | Status: DC | PRN
  Administered 2015-05-31 – 2015-06-01 (×2): 650 mg via ORAL
  Filled 2015-05-31 (×2): qty 2

## 2015-05-31 MED ORDER — MIDAZOLAM HCL 5 MG/ML IJ SOLN
INTRAMUSCULAR | Status: AC
  Filled 2015-05-31: qty 2

## 2015-05-31 MED ORDER — DIPHENHYDRAMINE HCL 50 MG/ML IJ SOLN
INTRAMUSCULAR | Status: AC
  Filled 2015-05-31: qty 1

## 2015-05-31 MED ORDER — SODIUM CHLORIDE 0.9 % IV SOLN: INTRAVENOUS | Status: DC

## 2015-05-31 MED ORDER — FENTANYL CITRATE (PF) 100 MCG/2ML IJ SOLN
INTRAMUSCULAR | Status: DC | PRN
  Administered 2015-05-31 (×2): 25 ug via INTRAVENOUS

## 2015-05-31 MED ORDER — DIPHENHYDRAMINE HCL 50 MG/ML IJ SOLN
INTRAMUSCULAR | Status: DC | PRN
  Administered 2015-05-31: 25 mg via INTRAVENOUS

## 2015-05-31 SURGICAL SUPPLY — 2 items
LOOP REVEAL LINQSYS (Prosthesis & Implant Heart) ×2 IMPLANT
PACK LOOP INSERTION (CUSTOM PROCEDURE TRAY) ×2 IMPLANT

## 2015-05-31 NOTE — Interval H&P Note (Signed)
History and Physical Interval Note:  05/31/2015 8:16 AM  Austin Fuentes  has presented today for surgery, with the diagnosis of STROKE  The various methods of treatment have been discussed with the patient and family. After consideration of risks, benefits and other options for treatment, the patient has consented to  Procedure(s): TRANSESOPHAGEAL ECHOCARDIOGRAM (TEE) (N/A) as a surgical intervention .  The patient's history has been reviewed, patient examined, no change in status, stable for surgery.  I have reviewed the patient's chart and labs.  Questions were answered to the patient's satisfaction.     Vuong Musa

## 2015-05-31 NOTE — Progress Notes (Signed)
Pt returned from Loop Recorder placement.  Denies any complaints.  Surgical site CDI with tegaderm covering.

## 2015-05-31 NOTE — Progress Notes (Signed)
STROKE TEAM PROGRESS NOTE   HISTORY OF PRESENT ILLNESS Austin Fuentes is a 35 y.o. male with No past medical history who presents with abnormal sensation of depth perception being off on his left side that started this morning. He states that he was last normal prior to bed last night and subsequently on awakening this morning he reached for his phone and felt like his "left arm felt like jelly." throughout the day he has been reaching for things and they have not been where he expected them to be. Finally, his wife convinced him to come in and be checked out and a head CT revealed a hypodensity in the right parietal region consistent with infarct.  He denies any recent illnesses other than slight cough for the past couple of weeks. He denies headaches, denies neck pain.  LKW: 3/29 prior to bed tpa given?: no, Out of window Premorbid modified rankin scale: 0   SUBJECTIVE (INTERVAL HISTORY) Wife present present today at bedside. The patient just returned from TEE and loop placement is pending. 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 Lower extremity venous Dopplers were done earlier during this admission and were negative for DVT OBJECTIVE Temp:  [97 F (36.1 C)-97.8 F (36.6 C)] 97.5 F (36.4 C) (04/03 0924) Pulse Rate:  [62-96] 78 (04/03 0924) Cardiac Rhythm:  [-] Normal sinus rhythm (04/03 0903) Resp:  [14-21] 16 (04/03 0924) BP: (92-159)/(52-91) 92/61 mmHg (04/03 0924) SpO2:  [92 %-99 %] 92 % (04/03 0924)  CBC:   Recent Labs Lab 05/27/15 2259  05/29/15 0402 05/31/15 0230  WBC 11.3*  --  8.7 9.3  NEUTROABS 6.6  --  4.7  --   HGB 14.1  < > 13.4 13.8  HCT 43.6  < > 41.5 42.2  MCV 79.6  --  80.1 79.5  PLT 340  --  292 293  < > = values in this interval not displayed.  Basic Metabolic Panel:   Recent Labs Lab 05/29/15 0402 05/31/15 0230  NA 138 139  K 3.9 4.1  CL 103 105  CO2 27 27  GLUCOSE 96 94  BUN 9 10   CREATININE 0.89 0.89  CALCIUM 9.0 9.0    Lipid Panel:     Component Value Date/Time   CHOL 214* 05/28/2015 0551   TRIG 309* 05/28/2015 0551   HDL 29* 05/28/2015 0551   CHOLHDL 7.4 05/28/2015 0551   VLDL 62* 05/28/2015 0551   LDLCALC 123* 05/28/2015 0551   HgbA1c:  Lab Results  Component Value Date   HGBA1C 5.7* 05/28/2015   Urine Drug Screen: No results found for: LABOPIA, COCAINSCRNUR, LABBENZ, AMPHETMU, THCU, LABBARB    IMAGING I have personally reviewed the radiological images below and agree with the radiology interpretations.  Ct Angio Head and Neck  W/cm &/or Wo Cm 05/28/2015   Normal CTA of the head and neck.   Ct Head Wo Contrast 05/28/2015   Focal right parietal lobe infarct, likely subacute or acute. Correlation with clinical exam and further evaluation with MRI recommended. No acute intracranial hemorrhage.   Mr Kizzie Fantasia Contrast 05/28/2015   1. Acute ischemic nonhemorrhagic right parietal lobe infarcts without significant mass effect.  2. Additional subcentimeter cortical ischemic nonhemorrhagic left parietal lobe infarct.  3. Remote infarct involving the superior right cerebellar vermis.  4. Mild chronic small vessel ischemic disease.   2-D echocardiogram 05/28/2015 Study Conclusions - Left ventricle: The cavity size was normal. Systolic  function was  normal. The estimated ejection fraction was in the range of 60%  to 65%. Wall motion was normal; there were no regional wall  motion abnormalities. Left ventricular diastolic function  parameters were normal. - Aortic valve: There was no regurgitation. - Aortic root: The aortic root was normal in size. - Mitral valve: There was trivial regurgitation. - Right ventricle: The cavity size was normal. Wall thickness was  normal. Systolic function was normal. - Right atrium: The atrium was normal in size. - Tricuspid valve: There was no regurgitation. - Pulmonic valve: There was no regurgitation. -  Pulmonary arteries: Systolic pressure was within the normal  range. - Inferior vena cava: The vessel was normal in size. - Pericardium, extracardiac: There was no pericardial effusion. Impressions: - Normal study.    TCD bubble study - likely very small PFO  LE venous doppler - negative for DVT   PHYSICAL EXAM  Temp:  [97 F (36.1 C)-97.8 F (36.6 C)] 97.5 F (36.4 C) (04/03 0924) Pulse Rate:  [62-96] 78 (04/03 0924) Resp:  [14-21] 16 (04/03 0924) BP: (92-159)/(52-91) 92/61 mmHg (04/03 0924) SpO2:  [92 %-99 %] 92 % (04/03 0924)  General - Well nourished, well developed, in no apparent distress.  Ophthalmologic - Sharp disc margins OU.  Cardiovascular - Regular rate and rhythm with no murmur.  Mental Status -  Level of arousal and orientation to time, place, and person were intact. Language including expression, naming, repetition, comprehension was assessed and found intact. Fund of Knowledge was assessed and was intact.  Cranial Nerves II - XII - II - Visual field intact OU. III, IV, VI - Extraocular movements intact. V - Facial sensation intact bilaterally. VII - Facial movement intact bilaterally. VIII - Hearing & vestibular intact bilaterally. X - Palate elevates symmetrically. XI - Chin turning & shoulder shrug intact bilaterally. XII - Tongue protrusion intact.  Motor Strength - The patient's strength was normal in all extremities and pronator drift was absent.  Bulk was normal and fasciculations were absent.   Motor Tone - Muscle tone was assessed at the neck and appendages and was normal.  Reflexes - The patient's reflexes were 1+ in all extremities and he had no pathological reflexes.  Sensory - Light touch, temperature/pinprick, vibration and proprioception were assessed and were symmetrical.    Coordination - The patient had normal movements in the hands with no ataxia or dysmetria.  Tremor was absent.  Gait and Station -  deferred.   ASSESSMENT/PLAN Mr. Austin Fuentes is a 35 y.o. male with no significant past medical history presenting with depth perception abnormality and abnormal sensation left upper extremity. He did not receive IV t-PA due to late presentation and minimal deficits.  Strokes:  Bilateral infarcts probably embolic from an unknown source. 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   MRI -  Acute ischemic nonhemorrhagic right parietal lobe infarcts with subcentimeter cortical ischemic nonhemorrhagic left parietal lobe infarct. Remote infarct involving the superior right cerebellar vermis.  CTA head and neck - normal  Transcranial Dopplers - likely very small PFO  Lower extremity venous Dopplers - negative for DVT.   2D Echo EF 60-65%. 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 ESR 32 (H)  HgbA1c 5.7  VTE prophylaxis - Lovenox Diet Heart Room service appropriate?: Yes; Fluid consistency:: Thin  No antithrombotic prior to admission, now on aspirin 325 mg daily.  Patient counseled to be compliant with his antithrombotic medications  Ongoing aggressive stroke risk factor management  Therapy recommendations: No f/u PT recommended. Outpatient speech therapy recommended for cognition.  Disposition: Pending  Likely very small PFO  TCD bubble study spencer degree I even with valsalva  Not sure the relation to current stroke  TEE pending  Hyperlipidemia  Home meds:  No lipid lowering medications prior to admission.  LDL 123, goal < 70  Now on Lipitor 80 mg daily  Continue statin at discharge  Other Stroke Risk Factors  Advanced age  Obesity, Body mass index is 47.15 kg/(m^2).   Hx of previous stroke - by MRI  S/s OSA - need outpt sleep study  Other Active Problems  Mild leukocytosis, febrile, will order an infectious workup    Personally examined patient and images, and have participated in and  made any corrections needed to history, physical, neuro exam,assessment and plan as stated above.  I have personally obtained the history, evaluated lab date, reviewed imaging studies and agree with radiology interpretations. I discussed CT findings with the patient and wife and implications. He may benefit with endovascular PFO closure but needs aggressive risk factor modification as well as loop recorder for A. fib. He will also need outpatient polysomnogram to confirm sleep apnea and treatment with CPAP later   Antony Contras, MD Stroke Neurology      To contact Stroke Continuity provider, please refer to [email or web address] After hours, contact General Neurology

## 2015-05-31 NOTE — Progress Notes (Signed)
  Echocardiogram Echocardiogram Transesophageal has been performed.  Delcie RochENNINGTON, Shamra Bradeen 05/31/2015, 9:06 AM

## 2015-05-31 NOTE — Progress Notes (Signed)
[facility] Progress Note  Patient: Austin PortsJason T Fuentes WUJ:[id number]   PCP: Lanier EnsignSOLES, MEREDITH KEY, MD DOB: 1980/12/10   DOA: 05/27/2015   DOS: 05/31/2015   Date of Service: the patient was seen and examined on 05/31/2015  Subjective: Patient underwent TEE and loop recorder placement today tolerated the procedures well. Denies having any acute complaint at that and fatigue. Nutrition: Tolerating oral diet  Brief hospital course: Patient was admitted on 05/27/2015, with complaint of left-sided weakness, was found to have acute cerebral ischemic infarct. Currently further plan is TEE and loop recorder on Monday.  Assessment and Plan: 1. CVA (cerebral vascular accident) (HCC) Acute ischemic infarct on the right parietal lobe as well as left parietal lobe Echocardiogram shows ejection traction 06-65% without any wall motion abnormality or major valvular dysfunction. Lower leg Doppler is negative. A carotid Doppler shows no significant evidence of stenosis. Dyslipidemia is present. Hypercoagulable workup is currently pending. TEE and loop recorder is scheduled on Monday. There is evidence of possible PFO on transcranial ultrasound. Currently on aspirin. Lipitor 80 mg as well. Neurology disposition currently pending. We will await further recommendation  2. Morbid obesity. Suspected sleep apnea. Patient will need a sleep evaluation on discharge. Recommend neurology to follow-up in order as an outpatient.  3. Hypercoagulable workup is so far negative.  4. Atrial septal defect Will need evaluation for septal occluder  Activity: physical therapy outpatient OT and PT is recommended  Bowel regimen: last BM 05/28/2015 DVT Prophylaxis: subcutaneous Heparin Nutrition: Cardiac diet Advance goals of care discussion: full code  HPI: As per the H and P dictated on admission, "Austin PortsJason T Tutor is a 35 y.o. male with PMH of seasonal allergy and plantar fasciitis who presents to the ED with numbness to  the left side of his body and visual disturbance first noted at approximately 6:45 AM on 05/27/2015. Patient takes no prescription medications and typically enjoys good health. He went to bed in his usual state the night of 05/26/2015, and upon waking the following morning he noted his left arm to be numb. He initially thought that he must have slept on the arm, but soon realized that his left leg and face also had paresthesia. He reports that his visual acuity is intact, but he perceives a problem with his depth perception, bumping into things that he thought he was walking past. 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 He experienced some difficulty putting money in the appropriate compartment in the cash register and noted some mild cognitive slowing while at work. Eventually, upon his wife's insistence, he came into the ED for evaluation. He has never experienced similar symptoms previously and denies any personal or family history of VTE or premature heart attack. He has never smoked, does not drink alcohol, and denies use of illicit drugs. There's been no unilateral leg swelling or tenderness and he has never experienced palpitations. He reports that his symptoms are unchanged from time of onset." Procedures: Echocardiogram, bilateral lower extremity Doppler venous, bilateral carotid Doppler, transcranial Doppler  Consultants: neurology, cardiology  Antibiotics: Anti-infectives    None      Family Communication: no family was present at bedside, at the time of interview.   Disposition:  Expected discharge date: 06/01/2015  Barriers to safe discharge: Neurology recommendation  No intake or output data in the 24 hours ending 05/31/15 1935 Filed Weights   05/28/15  0304  Weight: 132.45 kg (292 lb)    Objective: Physical Exam: Filed Vitals:   05/31/15 0855 05/31/15 0924  05/31/15 1408 05/31/15 1714  BP: 141/89 92/61 105/66 102/67  Pulse: 90 78 84 83  Temp:  97.5 F (36.4 C) 97.8 F (36.6 C) 98.2 F (36.8 C)  TempSrc:  Oral  Oral  Resp: Height:      Weight:      SpO2: 93% 92% 93% 93%    General: Appear in no distress, no Rash; Oral Mucosa moist. Cardiovascular: S1 and S2 Present, no Murmur, no JVD Respiratory: Bilateral Air entry present and Clear to Auscultation, no Crackles, no wheezes Abdomen: Bowel Sound present, Soft and no tenderness Extremities: no Pedal edema, no calf tenderness Neurology: Grossly no focal neuro deficit.  Data Reviewed: CBC:  Recent Labs Lab 05/27/15 2259 05/27/15 2307 05/29/15 0402 05/31/15 0230  WBC 11.3*  --  8.7 9.3  NEUTROABS 6.6  --  4.7  --   HGB 14.1 15.3 13.4 13.8  HCT 43.6 45.0 41.5 42.2  MCV 79.6  --  80.1 79.5  PLT 340  --  292 293   Basic Metabolic Panel:  Recent Labs Lab 05/27/15 2259 05/27/15 2307 05/29/15 0402 05/31/15 0230  NA 139 142 138 139  K 3.6 4.0 3.9 4.1  CL 104 102 103 105  CO2 26  --  27 27  GLUCOSE 120* 109* 96 94  BUN CREATININE 0.98 1.00 0.89 0.89  CALCIUM 9.2  --  9.0 9.0   Liver Function Tests:  Recent Labs Lab 05/27/15 2259  AST 25  ALT 26  ALKPHOS 64  BILITOT 0.6  PROT 7.0  ALBUMIN 3.8   No results for input(s): LIPASE, AMYLASE in the last 168 hours. No results for input(s): AMMONIA in the last 168 hours.  Cardiac Enzymes:  Recent Labs Lab 05/27/15 2259  TROPONINI <0.03    BNP (last 3 results) No results for input(s): BNP in the last 8760 hours.  CBG:  Recent Labs Lab 05/30/15 1616 05/30/15 2223 05/31/15 0633 05/31/15 1116 05/31/15 1658  GLUCAP 79 99 99 95 114*    No results found for this or any previous visit (from the past 240 hour(s)).   Studies: No results found.   Scheduled Meds: . aspirin  300 mg Rectal Daily   Or  . aspirin  325 mg Oral Daily  . atorvastatin  80 mg Oral q1800  . enoxaparin  (LOVENOX) injection  40 mg Subcutaneous Daily   Continuous Infusions:  PRN Meds: senna-docusate  Time spent: 30 minutes  Author: Lynden Oxford, MD [facility] Pager: [phone] 05/31/2015 7:35 PM  If 7PM-7AM, please contact night-coverage at [email or web address], password [facility]

## 2015-05-31 NOTE — Op Note (Signed)
INDICATIONS: stroke  PROCEDURE:   Informed consent was obtained prior to the procedure. The risks, benefits and alternatives for the procedure were discussed and the patient comprehended these risks.  Risks include, but are not limited to, cough, sore throat, vomiting, nausea, somnolence, esophageal and stomach trauma or perforation, bleeding, low blood pressure, aspiration, pneumonia, infection, trauma to the teeth and death.    After a procedural time-out, the oropharynx was anesthetized with 20% benzocaine spray. The patient was given 4 mg versed and 50 mcg fentanyl and 25 mg diphenhydramine for moderate sedation. The transesophageal probe was inserted in the esophagus and stomach without difficulty and multiple views were obtained.  The patient was kept under observation until the patient left the procedure room.  The patient left the procedure room in stable condition.   Agitated microbubble saline contrast was administered.  COMPLICATIONS:    There were no immediate complications.  FINDINGS:  Small atrial septal defect with atrial septal aneurysm. At rest, there is a small to moderate left to right shunt. There is prompt shunt reversal with light snoring.  Suspect sleep apnea syndrome as well.  RECOMMENDATIONS:    Consider atrial septal occluder.  Time Spent Directly with the Patient: Sedation initiated at 0819h. Last dose at 0823h. Supervision until 0850h.  Total time: 45 minutes   Austin Fuentes 05/31/2015, 8:32 AM

## 2015-05-31 NOTE — Progress Notes (Signed)
Pt transported for loop recorder placement.

## 2015-05-31 NOTE — Op Note (Signed)
LOOP RECORDER IMPLANT   Procedure report  Loop recorder implantation   Reason for procedure:  Cryptogenic stroke Procedure performed by:  Thurmon FairMihai Bernita Beckstrom, MD  Complications:  None  Estimated blood loss:  <5 mL  Medications administered during procedure:  Lidocaine 1% with 1/10,000 epinephrine 10 mL locally Device details:  Medtronic Reveal Linq model number [id number], serial number [id number] Procedure details:  After the risks and benefits of the procedure were discussed the patient provided informed consent. The patient was prepped and draped in usual sterile fashion. Local anesthesia was administered to an area 2 cm to the left of the sternum in the 4th intercostal space. A cutaneous incision was made using the incision tool. The introducer was then used to create a subcutaneous tunnel and carefully deploy the device. Local pressure was held to ensure hemostasis.  The incision was closed with SteriStrips and a sterile dressing was applied.  R waves 0.26 mV  Thurmon FairMihai Alli Jasmer, MD, [facility] [phone] office [phone] pager 05/31/2015 3:29 PM

## 2015-05-31 NOTE — Progress Notes (Signed)
Speech Language Pathology Treatment: Cognitive-Linquistic  Patient Details Name: Austin Fuentes MRN: [id number] DOB: October 17, 1980 Today's Date: 05/31/2015 Time: [id number] SLP Time Calculation (min) (ACUTE ONLY): 17 min  Assessment / Plan / Recommendation Clinical Impression  Pt completed money management task with extra time for processing and Min cues for complex problem solving and self-monitoring/correcting. No cues needed for alternating attention throughout activity. Pt shares that he is typically very good at math but expressed his frustration during task today, saying that he was experiencing increased difficulty today. 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   HPI HPI: 35 y.o. male with no significant past medical history presenting with depth perception abnormality, abnormal sensation LUE, and slowed cognitive processing. MRI shows bilateral acute parietal lobe infarcts as well as remote infarct involving the superior right cerebellar vermis.      SLP Plan  Other (Comment) (defer additional f/u to OP SLP)     Recommendations                Follow up Recommendations: Outpatient SLP;Other (comment) (intermittent supervision (during complex tasks)) Plan: Other (Comment) (defer additional f/u to OP SLP)     GO               Maxcine HamLaura Paiewonsky, M.A. CCC-SLP [phone]  Maxcine Hamaiewonsky, [name] 05/31/2015, 4:42 PM

## 2015-06-01 ENCOUNTER — Encounter (HOSPITAL_COMMUNITY): Payer: Self-pay | Admitting: Cardiovascular Disease

## 2015-06-01 LAB — GLUCOSE, CAPILLARY
GLUCOSE-CAPILLARY: 126 mg/dL — AB (ref 65–99)
Glucose-Capillary: 86 mg/dL (ref 65–99)

## 2015-06-01 MED ORDER — ATORVASTATIN CALCIUM 80 MG PO TABS: 80.0000 mg | ORAL_TABLET | Freq: Every day | ORAL | Status: DC

## 2015-06-01 MED ORDER — SENNOSIDES-DOCUSATE SODIUM 8.6-50 MG PO TABS: 1.0000 | ORAL_TABLET | Freq: Every evening | ORAL | Status: DC | PRN

## 2015-06-01 MED ORDER — ASPIRIN 325 MG PO TABS: 325.0000 mg | ORAL_TABLET | Freq: Every day | ORAL | Status: DC

## 2015-06-01 NOTE — Discharge Summary (Signed)
[facility] Discharge Summary   Patient: Austin Fuentes WCH:[id number]   PCP: Sabino Snipes KEY, MD DOB: 1980/11/04   Date of admission: 05/27/2015   Date of discharge:  06/01/2015    Discharge Diagnoses:  Principal Problem:   CVA (cerebral vascular accident) [facility]) Active Problems:   Acute ischemic stroke ([place])   Cerebral infarction due to unspecified mechanism   Needs sleep apnea assessment   Severe obesity (BMI >= 40) ([place])   Hyperlipidemia   Ostium secundum atrial septal defect  Recommendations for Outpatient Follow-up:  1. Please follow-up with PCP as well as specialist as scheduled   Follow-up Information    Follow up with SOLES, MEREDITH KEY, MD. Schedule an appointment as soon as possible for a visit in 1 week.   Specialty:  Family Medicine   Why:  will need sleep study   Contact information:   [address] [place] [address] [phone]       Follow up with SETHI,PRAMOD, MD In 2 months.   Specialties:  Neurology, Radiology   Contact information:   [place] [address] [phone]       Follow up with [facility].   Why:  They will contact you to set up the first appointment.    Contact information:   [place]  [phone]      Follow up with Sherren Mocha, MD. Call in 1 week.   Specialty:  Cardiology   Why:  If you do not receive a call back for appointment   Contact information:   [address] [address] [phone]      Diet recommendation: Cardiac diet  Activity: The patient is advised to gradually reintroduce usual activities.  Discharge Condition: good  History of present illness: As per the H and P dictated on admission, "Austin Fuentes is a 35 y.o. male with PMH of seasonal allergy and plantar fasciitis who presents to the ED with numbness to the left side of his body and visual disturbance first noted at approximately 6:45 AM on  05/27/2015. Patient takes no prescription medications and typically enjoys good health. He went to bed in his usual state the night of 05/26/2015, and upon waking the following morning he noted his left arm to be numb. He initially thought that he must have slept on the arm, but soon realized that his left leg and face also had paresthesia. He reports that his visual acuity is intact, but he perceives a problem with his depth perception, bumping into things that he thought he was walking past. Despite his wife's encouragement to seek medical evaluation, he insisted on taking his daughter's to school in the morning and then reporting for work as a Scientist, water quality. He experienced some difficulty putting money in the appropriate compartment in the cash register and noted some mild cognitive slowing while at work. Eventually, upon his wife's insistence, he came into the ED for evaluation. He has never experienced similar symptoms previously and denies any personal or family history of VTE or premature heart attack. He has never smoked, does not drink alcohol, and denies use of illicit drugs. There's been no unilateral leg swelling or tenderness and he has never experienced palpitations. He reports that his symptoms are unchanged from time of onset"  Hospital Course:  Summary of his active problems in the hospital is as following. 1. CVA (cerebral vascular accident) ([place]) Acute ischemic infarct on the right  parietal lobe as well as left parietal lobe Echocardiogram shows ejection traction 60-65% without any wall motion abnormality or major valvular dysfunction. Lower leg Doppler is negative. A carotid Doppler shows no significant evidence of stenosis. Dyslipidemia is present. Hypercoagulable workup is currently pending. TEE and loop recorder were performed and patient tolerated well. 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 Currently on aspirin. Lipitor 80 mg as well. PTOT  recommended no follow-up, speech therapy recommended outpatient speech therapy which was arranged.  2. Morbid obesity. Suspected sleep apnea. Patient will need a sleep evaluation on discharge. Recommend neurology to follow-up in order as an outpatient.  3. Hypercoagulable workup is so far negative, including lupus anticoagulant, beta 2 glycoprotein, homocystine, cardiolipin antibody, CRP and ANA. Alpha galactosidase is pending. ESR 32 mild elevated  4. Atrial septal defect Will need evaluation for septal occluder Appreciate cardiology who will call the patient after discharge for a follow-up appointment with Dr. Burt Knack.   All other chronic medical condition were stable during the hospitalization.  Patient was seen by physical therapy, who recommended no therapy on discharge but speech therapy recommended outpatient speech for cognitive treatment, which was arranged by social worker and case Freight forwarder. On the day of the discharge the patient's vitals were stable, and no other acute medical condition were reported by patient. the patient was felt safe to be discharge at home with family.  Procedures and Results:  Echocardiogram Study Conclusions  - Left ventricle: The cavity size was normal. Systolic function was  normal. The estimated ejection fraction was in the range of 60%  to 65%. Wall motion was normal; there were no regional wall  motion abnormalities. Left ventricular diastolic function  parameters were normal. - Aortic valve: There was no regurgitation. - Aortic root: The aortic root was normal in size. - Mitral valve: There was trivial regurgitation. - Right ventricle: The cavity size was normal. Wall thickness was  normal. Systolic function was normal. - Right atrium: The atrium was normal in size. - Tricuspid valve: There was no regurgitation. - Pulmonic valve: There was no regurgitation. - Pulmonary arteries: Systolic pressure was within the normal  range. -  Inferior vena cava: The vessel was normal in size. - Pericardium, extracardiac: There was no pericardial effusion.  Impressions:  - Normal study.   TEE Study Conclusions  - Left ventricle: The cavity size was normal. Wall thickness was  normal. Systolic function was normal. The estimated ejection  fraction was in the range of 55% to 60%. Wall motion was normal;  there were no regional wall motion abnormalities. - Left atrium: No evidence of thrombus in the atrial cavity or  appendage. - Right atrium: No evidence of thrombus in the atrial cavity or  appendage. - Atrial septum: There was a medium-sized, 0.4 cm x 0.3 cm high  secundum atrial septal defect. Doppler and agitated saline  contrast study showed a small bidirectional, but predominantly  left-to-right, atrial level shunt, in the baseline state.  Immediate and marked reversal of shunt is seen with snoring.  There was an atrial septal aneurysm.   Loop recorder insertion   Consultations:  Cardiology  Neurology  DISCHARGE MEDICATION: Current Discharge Medication List    START taking these medications   Details  aspirin 325 MG tablet Take 1 tablet (325 mg total) by mouth daily. Qty: 30 tablet, Refills: 0    atorvastatin (LIPITOR) 80 MG tablet Take 1 tablet (80 mg total) by mouth daily at 6 PM.  Qty: 60 tablet, Refills: 0    senna-docusate (SENOKOT-S) 8.6-50 MG tablet Take 1 tablet by mouth at bedtime as needed for mild constipation or moderate constipation. Qty: 10 tablet, Refills: 0      STOP taking these medications     ibuprofen (ADVIL,MOTRIN) 200 MG tablet        No Known Allergies Discharge Instructions    Ambulatory referral to Cardiology    Complete by:  As directed   Referral for ASD closure device     Ambulatory referral to Occupational Therapy    Complete by:  As directed      Ambulatory referral to Sleep Studies    Complete by:  As directed      Ambulatory referral to Speech  Therapy    Complete by:  As directed           Discharge Exam: Filed Weights   05/28/15 0304  Weight: 132.45 kg (292 lb)   Filed Vitals:   06/01/15 0522 06/01/15 1138  BP: 92/62 98/75  Pulse: 65 80  Temp: 97.6 F (36.4 C) 97.5 F (36.4 C)  Resp: 18 18   General: Appear in no distress, no Rash; Oral Mucosa moist. Cardiovascular: S1 and S2 Present, no Murmur, no JVD Respiratory: Bilateral Air entry present and Clear to Auscultation, no Crackles, no wheezes Abdomen: Bowel Sound present, Soft and no tenderness Extremities: no Pedal edema, no calf tenderness Neurology: Grossly no focal neuro deficit.  The results of significant diagnostics from this hospitalization (including imaging, microbiology, ancillary and laboratory) are listed below for reference.    Significant Diagnostic Studies: Ct Angio Head W/cm &/or Wo Cm  05/28/2015  CLINICAL DATA:  Initial valuation for acute stroke. EXAM: CT ANGIOGRAPHY HEAD AND NECK TECHNIQUE: Multidetector CT imaging of the head and neck was performed using the standard protocol during bolus administration of intravenous contrast. Multiplanar CT image reconstructions and MIPs were obtained to evaluate the vascular anatomy. Carotid stenosis measurements (when applicable) are obtained utilizing NASCET criteria, using the distal internal carotid diameter as the denominator. CONTRAST:  54m OMNIPAQUE IOHEXOL 350 MG/ML SOLN COMPARISON:  Prior MRI and CT from earlier the same day FINDINGS: CTA NECK Aortic arch: Study somewhat limited by patient body habitus. Aortic arch of normal caliber with normal branch pattern. No high-grade stenosis at the origin of the great vessels. Visualized subclavian arteries are widely patent. Right carotid system: Right common carotid artery widely patent from its origin to the bifurcation. Right ICA widely patent from the bifurcation to the skullbase. No stenosis, dissection, or vascular occlusion within the right carotid artery  system. Left carotid system: Left common carotid artery widely patent from its origin to the bifurcation. Left ICA widely patent from the bifurcation to the skullbase. No dissection, stenosis, or vascular occlusion within the left carotid artery system. Vertebral arteries:Both vertebral arteries arise from the subclavian arteries. Vertebral arteries widely patent along their entire course without dissection, stenosis, or occlusion. Skeleton: No acute osseous abnormality. No worrisome lytic or blastic osseous lesions. Other neck: Visualized lungs are clear. Visualized superior mediastinum within normal limits. Thyroid gland normal. No adenopathy. No acute soft tissue abnormality within the neck. CTA HEAD Anterior circulation: Petrous, cavernous, and supraclinoid segments widely patent bilaterally. A1 segments, anterior communicating artery, and anterior cerebral arteries widely patent. M1 segments patent without stenosis or occlusion. MCA bifurcations normal. Distal MCA branches symmetric without proximal stenosis or occlusion. Posterior circulation: Vertebral arteries patent to the vertebrobasilar junction. Posterior inferior cerebral arteries patent bilaterally.  Basilar artery widely patent to its distal aspect. Superior cerebellar arteries patent bilaterally. Left PCA arises from the basilar artery and is well opacified to its distal aspect. Fetal type right PCA supplied via a patent right posterior communicating artery. Right PCA opacified to its distal aspect. Venous sinuses: No evidence for venous sinus thrombosis Anatomic variants: Fetal type right PCA.  No aneurysm. Delayed phase: No abnormal enhancement. Evolving right parietal infarct noted. IMPRESSION: Normal CTA of the head and neck. Electronically Signed   By: Jeannine Boga M.D.   On: 05/28/2015 05:49   Dg Chest 2 View  05/28/2015  CLINICAL DATA:  35 year old male with stroke EXAM: CHEST  2 VIEW COMPARISON:  None. FINDINGS: The heart size and  mediastinal contours are within normal limits. Both lungs are clear. The visualized skeletal structures are unremarkable. IMPRESSION: No active cardiopulmonary disease. Electronically Signed   By: Anner Crete M.D.   On: 05/28/2015 03:33   Ct Head Wo Contrast  05/28/2015  ADDENDUM REPORT: 05/28/2015 00:09 ADDENDUM: These results were called by telephone at the time of interpretation on 05/28/2015 at 12:06 am to Dr. Betsey Holiday , who verbally acknowledged these results. Electronically Signed   By: Anner Crete M.D.   On: 05/28/2015 00:09  05/28/2015  CLINICAL DATA:  35 year old male with left-sided weakness and numbness. EXAM: CT HEAD WITHOUT CONTRAST TECHNIQUE: Contiguous axial images were obtained from the base of the skull through the vertex without intravenous contrast. COMPARISON:  None. FINDINGS: There is no acute intracranial hemorrhage. There is a focal area of hypodensity in the right posterior parietal convexity with involvement of the gray and white matter and loss of gray-white matter discrimination most compatible with an ischemic infarct, likely subacute and less likely acute. Correlation with clinical exam and duration of neurologic symptoms and further evaluation with MRI recommended. The ventricles and sulci are appropriate size for patient's age. There is no mass effect or midline shift. There is mucoperiosteal thickening of the left maxillary sinus and left sphenoid sinus as well as mucoperiosteal thickening of multiple ethmoid air cells. No air-fluid levels. The mastoid air cells are clear. The calvarium is intact. 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 Correlation with clinical exam and further evaluation with MRI recommended. No acute intracranial hemorrhage. Electronically Signed: By: Anner Crete M.D. On: 05/27/2015 23:56   Ct Angio Neck W/cm &/or Wo/cm  05/28/2015  CLINICAL DATA:  Initial valuation for acute stroke. EXAM: CT ANGIOGRAPHY HEAD AND  NECK TECHNIQUE: Multidetector CT imaging of the head and neck was performed using the standard protocol during bolus administration of intravenous contrast. Multiplanar CT image reconstructions and MIPs were obtained to evaluate the vascular anatomy. Carotid stenosis measurements (when applicable) are obtained utilizing NASCET criteria, using the distal internal carotid diameter as the denominator. CONTRAST:  36m OMNIPAQUE IOHEXOL 350 MG/ML SOLN COMPARISON:  Prior MRI and CT from earlier the same day FINDINGS: CTA NECK Aortic arch: Study somewhat limited by patient body habitus. Aortic arch of normal caliber with normal branch pattern. No high-grade stenosis at the origin of the great vessels. Visualized subclavian arteries are widely patent. Right carotid system: Right common carotid artery widely patent from its origin to the bifurcation. Right ICA widely patent from the bifurcation to the skullbase. No stenosis, dissection, or vascular occlusion within the right carotid artery system. Left carotid system: Left common carotid artery widely patent from its origin to the bifurcation. Left ICA widely patent from the bifurcation to the skullbase. No  dissection, stenosis, or vascular occlusion within the left carotid artery system. Vertebral arteries:Both vertebral arteries arise from the subclavian arteries. Vertebral arteries widely patent along their entire course without dissection, stenosis, or occlusion. Skeleton: No acute osseous abnormality. No worrisome lytic or blastic osseous lesions. Other neck: Visualized lungs are clear. Visualized superior mediastinum within normal limits. Thyroid gland normal. No adenopathy. No acute soft tissue abnormality within the neck. CTA HEAD Anterior circulation: Petrous, cavernous, and supraclinoid segments widely patent bilaterally. A1 segments, anterior communicating artery, and anterior cerebral arteries widely patent. M1 segments patent without stenosis or occlusion. MCA  bifurcations normal. Distal MCA branches symmetric without proximal stenosis or occlusion. Posterior circulation: Vertebral arteries patent to the vertebrobasilar junction. Posterior inferior cerebral arteries patent bilaterally. Basilar artery widely patent to its distal aspect. Superior cerebellar arteries patent bilaterally. Left PCA arises from the basilar artery and is well opacified to its distal aspect. Fetal type right PCA supplied via a patent right posterior communicating artery. Right PCA opacified to its distal aspect. Venous sinuses: No evidence for venous sinus thrombosis Anatomic variants: Fetal type right PCA.  No aneurysm. Delayed phase: No abnormal enhancement. Evolving right parietal infarct noted. IMPRESSION: Normal CTA of the head and neck. Electronically Signed   By: Jeannine Boga M.D.   On: 05/28/2015 05:49   Mr Jeri Cos JQ Contrast  05/28/2015  CLINICAL DATA:  Initial evaluation for acute left facial, arm, and [place] numbness. EXAM: MRI HEAD WITHOUT AND WITH CONTRAST TECHNIQUE: Multiplanar, multiecho pulse sequences of the brain and surrounding structures were obtained without and with intravenous contrast. CONTRAST:  86m MULTIHANCE GADOBENATE DIMEGLUMINE 529 MG/ML IV SOLN COMPARISON:  Prior CT from 05/27/2015. FINDINGS: Cerebral volume within normal limits for patient age. Patchy T2/FLAIR hyperintensity within the periventricular and deep white matter both cerebral hemispheres present, most like related to mild chronic small vessel ischemic disease. Focal encephalomalacia within the superior right cerebellar vermis, consistent with remote infarct. No other areas of chronic infarction identified. Abnormal wedge-shaped restricted diffusion within the right parietal lobe, consistent with acute ischemic infarct, corresponding to previously seen abnormality on prior CT. This is cortical and subcortical in nature. Smaller 6 mm cortical infarct more inferiorly (series 4, image 29).  Additional 6 mm cortical infarct within the left parietal lobe (series 4, image 39). These are acute in appearance with corresponding signal loss on ADC map. No associated enhancement as would be expected with subacute infarcts. No associated hemorrhage. No other acute infarct. Major intracranial vascular flow voids are maintained. Gray-white matter differentiation otherwise preserved. No acute or chronic intracranial hemorrhage. No mass lesion, midline shift, or mass effect. No hydrocephalus. No extra-axial fluid collection. No abnormal enhancement. Major dural sinuses are patent. Craniocervical junction within normal limits. Visualized upper cervical spine unremarkable. Pituitary gland normal.  No acute abnormality about the orbits. Scattered mucosal thickening within the paranasal sinuses. No air-fluid levels to suggest active sinus infection. No mastoid effusion. Inner ear structures grossly normal. Bone marrow signal intensity within normal limits. No scalp soft tissue abnormality. IMPRESSION: 1. Acute ischemic nonhemorrhagic right parietal lobe infarcts without significant mass effect. 2. Additional subcentimeter cortical ischemic nonhemorrhagic left parietal lobe infarct. 3. Remote infarct involving the superior right cerebellar vermis. 4. Mild chronic small vessel ischemic disease. Electronically Signed   By: BJeannine BogaM.D.   On: 05/28/2015 03:07    Microbiology: No results found for this or any previous visit (from the past 240 hour(s)).   Labs: CBC:  Recent Labs Lab 05/27/15 2259  05/27/15 2307 05/29/15 0402 05/31/15 0230  WBC 11.3*  --  8.7 9.3  NEUTROABS 6.6  --  4.7  --   HGB 14.1 15.3 13.4 13.8  HCT 43.6 45.0 41.5 42.2  MCV 79.6  --  80.1 79.5  PLT 340  --  292 867   Basic Metabolic Panel:  Recent Labs Lab 05/27/15 2259 05/27/15 2307 05/29/15 0402 05/31/15 0230  NA 139 142 138 139  K 3.6 4.0 3.9 4.1  CL 104 102 103 105  CO2 26  --  27 27  GLUCOSE 120* 109* 96  94  BUN '9 13 9 10  ' CREATININE 0.98 1.00 0.89 0.89  CALCIUM 9.2  --  9.0 9.0   Liver Function Tests:  Recent Labs Lab 05/27/15 2259  AST 25  ALT 26  ALKPHOS 64  BILITOT 0.6  PROT 7.0  ALBUMIN 3.8   No results for input(s): LIPASE, AMYLASE in the last 168 hours. No results for input(s): AMMONIA in the last 168 hours. Cardiac Enzymes:  Recent Labs Lab 05/27/15 2259  TROPONINI <0.03   BNP (last 3 results) No results for input(s): BNP in the last 8760 hours. CBG:  Recent Labs Lab 05/31/15 1116 05/31/15 1658 05/31/15 2155 06/01/15 0644 06/01/15 1232  GLUCAP 95 114* 123* 86 126*   Time spent: 30 minutes  Signed:  Havard Radigan  [facility]  06/01/2015  , 2:27 PM

## 2015-06-01 NOTE — Progress Notes (Signed)
STROKE TEAM PROGRESS NOTE   HISTORY OF PRESENT ILLNESS Austin Fuentes is a 35 y.o. male with No past medical history who presents with abnormal sensation of depth perception being off on his left side that started this morning. He states that he was last normal prior to bed last night and subsequently on awakening this morning he reached for his phone and felt like his "left arm felt like jelly." throughout the day he has been reaching for things and they have not been where he expected them to be. Finally, his wife convinced him to come in and be checked out and a head CT revealed a hypodensity in the right parietal region consistent with infarct.  He denies any recent illnesses other than slight cough for the past couple of weeks. He denies headaches, denies neck pain.  LKW: 3/29 prior to bed tpa given?: no, Out of window Premorbid modified rankin scale: 0   SUBJECTIVE (INTERVAL HISTORY) Wife present present today at bedside. The patient hadd loop placement y`day and is ready for discharge. No complaints.  OBJECTIVE Temp:  [97.5 F (36.4 C)-98.2 F (36.8 C)] 97.5 F (36.4 C) (04/04 1138) Pulse Rate:  [65-91] 80 (04/04 1138) Cardiac Rhythm:  [-] Sinus bradycardia (04/04 0700) Resp:  [18-20] 18 (04/04 1138) BP: (92-110)/(62-75) 98/75 mmHg (04/04 1138) SpO2:  [93 %-97 %] 94 % (04/04 1138)  CBC:   Recent Labs Lab 05/27/15 2259  05/29/15 0402 05/31/15 0230  WBC 11.3*  --  8.7 9.3  NEUTROABS 6.6  --  4.7  --   HGB 14.1  < > 13.4 13.8  HCT 43.6  < > 41.5 42.2  MCV 79.6  --  80.1 79.5  PLT 340  --  292 293  < > = values in this interval not displayed.  Basic Metabolic Panel:   Recent Labs Lab 05/29/15 0402 05/31/15 0230  NA 138 139  K 3.9 4.1  CL 103 105  CO2 27 27  GLUCOSE 96 94  BUN 9 10  CREATININE 0.89 0.89  CALCIUM 9.0 9.0    Lipid Panel:     Component Value Date/Time   CHOL 214* 05/28/2015 0551   TRIG 309* 05/28/2015 0551   HDL 29* 05/28/2015 0551   CHOLHDL 7.4 05/28/2015 0551   VLDL 62* 05/28/2015 0551   LDLCALC 123* 05/28/2015 0551   HgbA1c:  Lab Results  Component Value Date   HGBA1C 5.7* 05/28/2015   Urine Drug Screen: No results found for: LABOPIA, COCAINSCRNUR, LABBENZ, AMPHETMU, THCU, LABBARB    IMAGING I have personally reviewed the radiological images below and agree with the radiology interpretations.  Ct Angio Head and Neck  W/cm &/or Wo Cm 05/28/2015   Normal CTA of the head and neck.   Ct Head Wo Contrast 05/28/2015   Focal right parietal lobe infarct, likely subacute or acute. Correlation with clinical exam and further evaluation with MRI recommended. No acute intracranial hemorrhage.   Mr Kizzie Fantasia Contrast 05/28/2015   1. Acute ischemic nonhemorrhagic right parietal lobe infarcts without significant mass effect.  2. Additional subcentimeter cortical ischemic nonhemorrhagic left parietal lobe infarct.  3. Remote infarct involving the superior right cerebellar vermis.  4. Mild chronic small vessel ischemic disease.   2-D echocardiogram 05/28/2015 Study Conclusions - Left ventricle: The cavity size was normal. Systolic function was  normal. The estimated ejection fraction was in the range of 60%  to 65%. Wall motion was normal; there were no regional wall  motion abnormalities.  Left ventricular diastolic function  parameters were normal. - Aortic valve: There was no regurgitation. - Aortic root: The aortic root was normal in size. - Mitral valve: There was trivial regurgitation. - Right ventricle: The cavity size was normal. Wall thickness was  normal. Systolic function was normal. - Right atrium: The atrium was normal in size. - Tricuspid valve: There was no regurgitation. - Pulmonic valve: There was no regurgitation. - Pulmonary arteries: Systolic pressure was within the normal  range. - Inferior vena cava: The vessel was normal in size. - Pericardium, extracardiac: There was no pericardial  effusion. Impressions: - Normal study.    TCD bubble study - likely very small PFO  LE venous doppler - negative for DVT   PHYSICAL EXAM  Temp:  [97.5 F (36.4 C)-98.2 F (36.8 C)] 97.5 F (36.4 C) (04/04 1138) Pulse Rate:  [65-91] 80 (04/04 1138) Resp:  [18-20] 18 (04/04 1138) BP: (92-110)/(62-75) 98/75 mmHg (04/04 1138) SpO2:  [93 %-97 %] 94 % (04/04 1138)  General - Well nourished, well developed, in no apparent distress.  Ophthalmologic - Sharp disc margins OU.  Cardiovascular - Regular rate and rhythm with no murmur.  Mental Status -  Level of arousal and orientation to time, place, and person were intact. Language including expression, naming, repetition, comprehension was assessed and found intact. Fund of Knowledge was assessed and was intact.  Cranial Nerves II - XII - II - Visual field intact OU. III, IV, VI - Extraocular movements intact. V - Facial sensation intact bilaterally. VII - Facial movement intact bilaterally. VIII - Hearing & vestibular intact bilaterally. X - Palate elevates symmetrically. XI - Chin turning & shoulder shrug intact bilaterally. XII - Tongue protrusion intact.  Motor Strength - The patient's strength was normal in all extremities and pronator drift was absent.  Bulk was normal and fasciculations were absent.   Motor Tone - Muscle tone was assessed at the neck and appendages and was normal.  Reflexes - The patient's reflexes were 1+ in all extremities and he had no pathological reflexes.  Sensory - Light touch, temperature/pinprick, vibration and proprioception were assessed and were symmetrical.    Coordination - The patient had normal movements in the hands with no ataxia or dysmetria.  Tremor was absent.  Gait and Station - deferred.   ASSESSMENT/PLAN Mr. Austin Fuentes is a 35 y.o. male with no significant past medical history presenting with depth perception abnormality and abnormal sensation left upper extremity. He  did not receive IV t-PA due to late presentation and minimal deficits.  Strokes:  Bilateral infarcts probably embolic from an unknown source. 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   MRI -  Acute ischemic nonhemorrhagic right parietal lobe infarcts with subcentimeter cortical ischemic nonhemorrhagic left parietal lobe infarct. Remote infarct involving the superior right cerebellar vermis.  CTA head and neck - normal  Transcranial Dopplers - likely very small PFO  Lower extremity venous Dopplers - negative for DVT.   2D Echo EF 60-65%. 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 ESR 32 (H)  HgbA1c 5.7  VTE prophylaxis - Lovenox Diet Heart Room service appropriate?: Yes; Fluid consistency:: Thin  No antithrombotic prior to admission, now on aspirin 325 mg daily.   Patient counseled to be compliant with his antithrombotic medications  Ongoing aggressive stroke risk factor management  Therapy recommendations: No f/u PT recommended. Outpatient speech therapy recommended for cognition.  Disposition: Pending  Likely very small PFO  TCD bubble study spencer degree I even with valsalva  Not sure the relation to current stroke  TEE pending  Hyperlipidemia  Home meds:  No lipid lowering medications prior to admission.  LDL 123, goal < 70  Now on Lipitor 80 mg daily  Continue statin at discharge  Other Stroke Risk Factors  Advanced age  Obesity, Body mass index is 47.15 kg/(m^2).   Hx of previous stroke - by MRI  S/s OSA - need outpt sleep study  Other Active Problems  Mild leukocytosis, febrile, will order an infectious workup    Personally examined patient and images, and have participated in and made any corrections needed to history, physical, neuro exam,assessment and plan as stated above.  I have personally obtained the history, evaluated lab date, reviewed imaging studies and agree with  radiology interpretations. I discussed TEE findings with the patient and wife and implications. He may benefit with endovascular PFO closure but needs aggressive risk factor modification as well as loop recorder for A. fib. He will also need outpatient polysomnogram to confirm sleep apnea and treatment with CPAP later.Follow up in stroke clinic in 2 months.    Antony Contras, MD Stroke Neurology      To contact Stroke Continuity provider, please refer to [email or web address] After hours, contact General Neurology

## 2015-06-01 NOTE — Care Management Note (Signed)
Case Management Note  Patient Details  Name: Austin Fuentes MRN: [id number] Date of Birth: [birth year]  Subjective/Objective:                    Action/Plan: Plan is for patient to discharge home with self care. Pt has orders for outpatient ST and OT. CM met with the patient and his wife and they would like to attend therapy in the [place] area. They were agreeable to the [facility]. CM called and left a message for [facility] and faxed over the patients information. CM followed up and they are able to see the patient. Information placed on the AVS. Will update the bedside RN.  Expected Discharge Date:                  Expected Discharge Plan:  Home/Self Care  In-House Referral:     Discharge planning Services  CM Consult  Post Acute Care Choice:    Choice offered to:     DME Arranged:    DME Agency:     HH Arranged:    [place] Agency:     Status of Service:  Completed, signed off  Medicare Important Message Given:    Date Medicare IM Given:    Medicare IM give by:    Date Additional Medicare IM Given:    Additional Medicare Important Message give by:     If discussed at [place] of Stay Meetings, dates discussed:    Additional Comments:  Pollie Friar, RN 06/01/2015, 11:49 AM

## 2015-06-01 NOTE — Progress Notes (Signed)
Pt for discharge home today. Discharge orders received. IVs and telemetry dcd with dressing clean dry and intact. Discharge instructions and  given with verbalized understanding. Handouts on stroke prevention given and patient advised to adhere to discharge instructions, meds, and follow up appts. Family at bedside to assist with discharge. Staff brought patient to lobby via wheelchair at 1430. Transported to home by family member.

## 2015-06-02 ENCOUNTER — Telehealth: Payer: Self-pay | Admitting: Family Medicine

## 2015-06-02 DIAGNOSIS — I6349 Cerebral infarction due to embolism of other cerebral artery: Secondary | ICD-10-CM

## 2015-06-02 NOTE — Assessment & Plan Note (Signed)
Order OT and SLP

## 2015-06-02 NOTE — Telephone Encounter (Signed)
Orders entered

## 2015-06-02 NOTE — Telephone Encounter (Signed)
-----   Message from Alba CoryKrichna Sowles, MD sent at 06/02/2015  1:15 PM EDT ----- Regarding: your patient   ----- Message -----    From: Marcie MowersBritta P McPherson    Sent: 06/01/2015   4:37 PM      To: Alba CoryKrichna Sowles, MD  [facility] He is coming to us on 4/7 for outpatient OT and SLP. [facility] and SLP for this patient. Reason is : cerebral infarction  Thanks!

## 2015-06-03 ENCOUNTER — Ambulatory Visit: Payer: Self-pay | Admitting: Family Medicine

## 2015-06-03 LAB — ALPHA GALACTOSIDASE: Alpha galactosidase, serum: 59.8 nmol/hr/mg prt (ref 28.0–80.0)

## 2015-06-04 ENCOUNTER — Encounter: Payer: Self-pay | Admitting: Occupational Therapy

## 2015-06-04 ENCOUNTER — Ambulatory Visit: Payer: BLUE CROSS/BLUE SHIELD | Attending: Family Medicine | Admitting: Occupational Therapy

## 2015-06-04 ENCOUNTER — Encounter: Payer: Self-pay | Admitting: Speech Pathology

## 2015-06-04 ENCOUNTER — Ambulatory Visit: Payer: BLUE CROSS/BLUE SHIELD | Admitting: Speech Pathology

## 2015-06-04 DIAGNOSIS — R41841 Cognitive communication deficit: Secondary | ICD-10-CM

## 2015-06-04 DIAGNOSIS — R278 Other lack of coordination: Secondary | ICD-10-CM

## 2015-06-04 DIAGNOSIS — R4189 Other symptoms and signs involving cognitive functions and awareness: Secondary | ICD-10-CM | POA: Diagnosis present

## 2015-06-04 NOTE — Therapy (Signed)
[place] [facility] MAIN [facility] SERVICES [address] [place], [place], [address] Phone: [phone]   Fax:  [phone]  Speech Language Pathology Evaluation  Patient Details  Name: SHANKAR SILBER MRN: [id number] Date of Birth: 08/28/1980 Referring Provider: Dr. Carlynn Purl  Encounter Date: 06/04/2015      End of Session - 06/04/15 1357    Visit Number 1   Number of Visits 5   Date for SLP Re-Evaluation 07/02/15      Past Medical History  Diagnosis Date  . Allergy   . Stroke [facility])     Past Surgical History  Procedure Laterality Date  . Tonsillectomy    . Tee without cardioversion N/A 05/31/2015    Procedure: TRANSESOPHAGEAL ECHOCARDIOGRAM (TEE);  Surgeon: Thurmon Fair, MD;  Location: [facility] ENDOSCOPY;  Service: Cardiovascular;  Laterality: N/A;  . Ep implantable device N/A 05/31/2015    Procedure: Loop Recorder Insertion;  Surgeon: Thurmon Fair, MD;  Location: [facility] INVASIVE CV LAB;  Service: Cardiovascular;  Laterality: N/A;  . Tonsillectomy      There were no vitals filed for this visit.      Subjective Assessment - 06/04/15 1359    Subjective At one week post CVA, this 35 year-old male is testing within normal limits for cognitive functioning. However, Mr. Membreno notes that he is experiencing some difficulty with thinking, writing and typing, characterized by greater effort, slower rate, and some difficulty spelling.            SLP Evaluation [facility] - 06/04/15 1007    SLP Visit Information   SLP Received On 06/04/15   Referring Provider Dr. Carlynn Purl   Onset Date 05/27/2015   Medical Diagnosis CVA   Subjective   Subjective At one week post CVA, this 35 year-old male is testing within normal limits for cognitive functioning. However, Mr. Maxcy notes that he is experiencing some difficulty with writing and typing, characterized by greater effort, slower rate, and some difficulty spelling.   Patient/Family Stated Goal Perform responsibilities  related to work and home.    Pain Assessment   Currently in Pain? No/denies   Prior Functional Status   Cognitive/Linguistic Baseline Within functional limits   Oral Motor/Sensory Function   Overall Oral Motor/Sensory Function Appears within functional limits for tasks assessed   Motor Speech   Overall Motor Speech Appears within functional limits for tasks assessed   Standardized Assessments   Standardized Assessments  Self-Administered Gero-Cognitive Examination        Self-Administered Gerocognitive Examination (SAGE): 22/22 100% Accuracy for non-scored items including: demographics, insight, family history, motor symptoms, stroke symptoms, depression symptoms, personality changes, and functional abilities. Orientation: 4/4 Naming: 2/2 Similarities: 2/2 Calculation: 2/2 Memory: 2/2 Construction:  3D figure: 2/2  Clock: 2/2 Verbal Fluency: 2/2 Executive:  Modified Trails: 2/2  Problem Solving: 2/2  Western Aphasia Battery Writing subtests: 50/50 Writing upon Request: 6/6 Writing Output: 34/34 Writing to Dictation: 10/10  Patient was 14/15 for moderately difficult verbal math problems.                  SLP Education - 06/04/15 1307    Education provided Yes   Education Details The role of the SLP in treatment of cognitive communication deficits   Person(s) Educated Patient   Methods Explanation   Comprehension Verbalized understanding            SLP Long Term Goals - 06/04/15 1316    SLP LONG TERM GOAL #1   Title Patient will  demonstrate functional cognitive-communication skills for successful return to work.   Status New   SLP LONG TERM GOAL #2   Title Patient will demonstrate functional cognitive-communication skills for independent completion of personal responsibilities.   Status New          Plan - 06/04/15 1344    Clinical Impression Statement At one week post CVA, this 35 year-old male is testing within normal limits for cognitive  functioning. The results of the Self-Administered Gerocognitive Examination (SAGE) indicate a composite severity within normal limits across all domains tested. Domains tested include orientation, naming, similarities, calculation, memory, construction, verbal fluency, and executive functioning. However, Mr. Renette ButtersGolden notes that he is experiencing some difficulty with thinking, writing and typing, characterized by greater effort, slower rate, and some difficulty spelling. Based on clinical judgment and the nature of difficulties he is experiencing related to activities of daily living and returning to work, speech language therapy with a focus on writing, organization, and compensatory strategies was recommended.  We made several recommendations to improve writing and thinking deficits during today's evaluation that included targeting these areas of difficulty with at-home practice, and to keep a running list of responsibilities and activities at work and at home that are giving him difficulty. Mr. Renette ButtersGolden was provided with materials to practice his writing and typing at home. 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 The patient will benefit from restorative and compensatory treatment of cognitive deficits to maximize independence.   Speech Therapy Frequency 1x /week   Duration 4 weeks   Treatment/Interventions Compensatory strategies;Functional tasks;SLP instruction and feedback   Potential to Achieve Goals Good   Potential Considerations Ability to learn/carryover information;Family/community support   SLP Home Exercise Plan to be determined   Consulted and Agree with Plan of Care Patient      Patient will benefit from skilled therapeutic intervention in order to improve the following deficits and impairments:   Cognitive communication deficit    Problem List Patient Active Problem List   Diagnosis Date Noted  . Ostium secundum atrial septal  defect   . CVA (cerebral vascular accident) (HCC) 05/28/2015  . Acute ischemic stroke (HCC) 05/28/2015  . Needs sleep apnea assessment 05/28/2015  . Severe obesity (BMI >= 40) (HCC) 05/28/2015  . Hyperlipidemia 05/28/2015  . Cerebral infarction due to unspecified mechanism   . PFO (patent foramen ovale)   . History of stroke     Elsie StainJoe Cammie Faulstich 06/04/2015, 2:00 PM  [place] [facility] [address] , [place], [id number] Phone: [phone]   Fax:  [phone]  Name: Adela PortsJason T Slaugh MRN: [id number] Date of Birth: [birth year]

## 2015-06-04 NOTE — Therapy (Signed)
[place] [facility] MAIN [facility] SERVICES [address] [place], [place], [address] Phone: [phone]   Fax:  [phone]  Occupational Therapy Evaluation  Patient Details  Name: Austin Fuentes MRN: [id number] Date of Birth: December 13, 1980 Referring Provider: Dr. Carlynn Purl  Encounter Date: 06/04/2015      OT End of Session - 06/04/15 1250    Visit Number 1   Number of Visits 8   Date for OT Re-Evaluation 07/30/15   OT Start Time 0845   OT Stop Time 0930   OT Time Calculation (min) 45 min   Activity Tolerance Patient tolerated treatment well   Behavior During Therapy [facility] for tasks assessed/performed      Past Medical History  Diagnosis Date  . Allergy   . Stroke [facility])     Past Surgical History  Procedure Laterality Date  . Tonsillectomy    . Tee without cardioversion N/A 05/31/2015    Procedure: TRANSESOPHAGEAL ECHOCARDIOGRAM (TEE);  Surgeon: Thurmon Fair, MD;  Location: [facility] ENDOSCOPY;  Service: Cardiovascular;  Laterality: N/A;  . Ep implantable device N/A 05/31/2015    Procedure: Loop Recorder Insertion;  Surgeon: Thurmon Fair, MD;  Location: [facility] INVASIVE CV LAB;  Service: Cardiovascular;  Laterality: N/A;  . Tonsillectomy      There were no vitals filed for this visit.      Subjective Assessment - 06/04/15 1237    Subjective  Pt. reports he will be returning to work this coming Sunday   Patient is accompained by: Family member   Pertinent History "Austin Fuentes is a 35 y.o. male with PMH of seasonal allergy and plantar fasciitis who presents to the ED with numbness to the left side of his body and visual disturbance first noted at approximately 6:45 AM on 05/27/2015. Patient takes no prescription medications and typically enjoys good health. He went to bed in his usual state the night of 05/26/2015, and upon waking the following morning he noted his left arm to be numb. He initially thought that he must have slept on the arm, but soon realized  that his left leg and face also had paresthesia. He reports that his visual acuity is intact, but he perceives a problem with his depth perception, bumping into things that he thought he was walking past. 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 He experienced some difficulty putting money in the appropriate compartment in the cash register and noted some mild cognitive slowing while at work. Eventually, upon his wife's insistence, he came into the ED for evaluation. He has never experienced similar symptoms previously and denies any personal or family history of VTE or premature heart attack. He has never smoked, does not drink alcohol, and denies use of illicit drugs. There's been no unilateral leg swelling or tenderness and he has never experienced palpitations   Patient Stated Goals To return to normal.   Currently in Pain? Yes   Pain Score 4    Pain Location Chest  Pt. has a digital heart monitor installed.           [facility] OT Assessment - 06/04/15 0001    Assessment   Diagnosis CVA   Referring Provider Dr. Carlynn Purl   Onset Date 05/27/15   Assessment Pt. had a CVA. Pt. reports having a hole is his heart.   Prior Therapy Rehab in the hospital   Balance Screen  Has the patient fallen in the past 6 months No   Has the patient had a decrease in activity level because of a fear of falling?  No   Is the patient reluctant to leave their home because of a fear of falling?  No   Home  Environment   Family/patient expects to be discharged to: Private residence   Living Arrangements Spouse/significant other   Available Help at Discharge Family   Home Access Stairs   Home Layout Two level   Alternate Level Stairs - Number of Steps 5   Bathroom Shower/Tub Walk-in Shower;Tub/Shower unit;Door;Curtain   EcologistBathroom Toilet Standard   Bathroom Accessibility Yes   How accessible  Accessible via walker   Home Equipment None   Lives With Spouse;Daughter   Prior Function   Level of Independence Independent   Vocation Full time employment   Psychologist, counsellingVocation Requirements Assistant Manager of Food [employer]   Leisure Work/children   ADL   Eating/Feeding Set up  increased time   Grooming Independent   Product managerUpper Body Bathing Independent   Lower Body Bathing Independent   Upper Body Dressing Independent   Lower Body Dressing Independent   Glass blower/designerToilet Tranfer Independent   Toileting -  Tour managerHygiene Independent   Tub/Shower Transfer Independent   ADL comments Pt. reports having difficulty stacking neat clothing and laundry piles. Pt. reports having difficulty with writing legibly, typing, clculating, handling money, using utensils, and cutting meat and veggies. Pt. scored a 77.6 on the MAM-20.   IADL   Shopping Needs to be accompanied on any shopping trip   Light Housekeeping Launders small items, rinses stockings, etc.;Does personal laundry completely   Training and development officerCommunity Mobility Drives own vehicle   Medication Management Is responsible for taking medication in correct dosages at correct time   Prior Level of Function Financial Management --  Difficulty processing easy math per patient.   Financial Management --  Wife handles the finances.   Written Expression   Dominant Hand Left   Handwriting 75% legible   Vision - History   Baseline Vision No visual deficits   Visual History --   Additional Comments Pt. reports iniitially having depth perception issues, however they have resolved.   Activity Tolerance   Activity Tolerance Tolerates 10-20 min activity with muiltiple rests   Cognition   Attention Focused   Focused Attention Appears intact   Problem Solving Impaired   Sensation   Light Touch Appears Intact   Proprioception Appears Intact   Coordination   Right 9 Hole Peg Test 21   Left 9 Hole Peg Test 22   AROM   Overall AROM Comments WFL overall   Strength   Overall Strength Comments  RUE 5/5 overall, Left shoulder flexion/abduction: 4+/5, elbow flexion/extension, forearm supination/pronation, wrist flexion/extension: 5/5   Hand Function   Right Hand Grip (lbs) 47   Right Hand Lateral Pinch 18 lbs   Right Hand 3 Point Pinch 13 lbs   Left Hand Grip (lbs) 75   Left Hand Lateral Pinch 30 lbs   Left 3 point pinch 27 lbs   Sensation Exercises   Stereognosis 5/5                              OT Long Term Goals - 06/04/15 1301    OT LONG TERM GOAL #1   Title Pt. will increased Left FMC by 3 sec. of speed to assist with typing.   Baseline Pt. has  difficulty   Time 8   Period Weeks   Status New   OT LONG TERM GOAL #2   Title Pt. will write a 4 lined paragraph with 100% legibility.   Baseline  Short phrases: 75% legible   Time 8   Period Weeks   Status New   OT LONG TERM GOAL #3   Title Pt. will independently, accurately, and efficiently calculate math and change needed in preparation for grocery store transactions.   Baseline Pt. has difficulty    Time 8   Period Weeks   Status New   OT LONG TERM GOAL #4   Title Pt. will  independently cut veggies.   Baseline Pt. has difficulty   Time 8   Period Weeks   Status New   OT LONG TERM GOAL #5   Title Pt. will independently stack fold and neatly stack laundry.   Baseline Pt. has difficulty making neat clothing stacks.   Time 8   Period Weeks   Status New               Plan - 06/04/15 1252    Clinical Impression Statement Pt. is a 35 y.o. male who was presents with mild impairments in dominant left hand coordination which interfere with his ability to complete ADL tasks including: writing, typing, cutting meat & veggies (cucumbers), stacking laundry . Pt. reports having difficulty processing simple math facts needed for ADL/IADL tasks. Pt. requires skilled OT services to improve ADL/IADL functioing, and further assess cognitive ADL/IADL tasks.    Rehab Potential Good   Clinical  Impairments Affecting Rehab Potential Positive indicators: age, motivation, family support. Negative Indicators: multiple comorbidities.   OT Frequency 1x / week   OT Duration 8 weeks   OT Treatment/Interventions Self-care/ADL training;Therapeutic exercises;Therapeutic activities;Patient/family education;Visual/perceptual remediation/compensation;Therapeutic exercise;Neuromuscular education;Cognitive remediation/compensation;Energy conservation;DME and/or AE instruction;Manual Therapy   Plan Plan to further asess cognitive ADL/IADL function with the CAM.   Consulted and Agree with Plan of Care Patient      Patient will benefit from skilled therapeutic intervention in order to improve the following deficits and impairments:  Decreased strength, Impaired UE functional use, Decreased coordination, Decreased activity tolerance, Decreased range of motion, Decreased balance, Impaired vision/preception, Impaired perceived functional ability, Pain, Decreased endurance, Decreased knowledge of use of DME  Visit Diagnosis: Other lack of coordination - Plan: Ot plan of care cert/re-cert  Cognitive deficits - Plan: Ot plan of care cert/re-cert    Problem List Patient Active Problem List   Diagnosis Date Noted  . Ostium secundum atrial septal defect   . CVA (cerebral vascular accident) (HCC) 05/28/2015  . Acute ischemic stroke (HCC) 05/28/2015  . Needs sleep apnea assessment 05/28/2015  . Severe obesity (BMI >= 40) (HCC) 05/28/2015  . Hyperlipidemia 05/28/2015  . Cerebral infarction due to unspecified mechanism   . PFO (patent foramen ovale)   . History of stroke    Olegario Messier, MS, OTR/L   Olegario Messier 06/04/2015, 1:33 PM  [place] [facility] MAIN [facility] SERVICES [address] [place], [place], [address] Phone: [phone]   Fax:  [phone]  Name: ARNET HOFFERBER MRN: [id number] Date of Birth: 1980-05-29

## 2015-06-08 ENCOUNTER — Ambulatory Visit (INDEPENDENT_AMBULATORY_CARE_PROVIDER_SITE_OTHER): Payer: BLUE CROSS/BLUE SHIELD | Admitting: Neurology

## 2015-06-08 ENCOUNTER — Encounter: Payer: Self-pay | Admitting: Neurology

## 2015-06-08 ENCOUNTER — Ambulatory Visit (INDEPENDENT_AMBULATORY_CARE_PROVIDER_SITE_OTHER): Payer: BLUE CROSS/BLUE SHIELD | Admitting: Family Medicine

## 2015-06-08 ENCOUNTER — Encounter: Payer: Self-pay | Admitting: Family Medicine

## 2015-06-08 VITALS — BP 124/84 | HR 110 | Temp 97.8°F | Resp 14 | Wt 294.0 lb

## 2015-06-08 DIAGNOSIS — Q2112 Patent foramen ovale: Secondary | ICD-10-CM

## 2015-06-08 DIAGNOSIS — G4726 Circadian rhythm sleep disorder, shift work type: Secondary | ICD-10-CM

## 2015-06-08 DIAGNOSIS — I6349 Cerebral infarction due to embolism of other cerebral artery: Secondary | ICD-10-CM | POA: Diagnosis not present

## 2015-06-08 DIAGNOSIS — Q211 Atrial septal defect: Secondary | ICD-10-CM

## 2015-06-08 DIAGNOSIS — I63031 Cerebral infarction due to thrombosis of right carotid artery: Secondary | ICD-10-CM

## 2015-06-08 DIAGNOSIS — E785 Hyperlipidemia, unspecified: Secondary | ICD-10-CM

## 2015-06-08 DIAGNOSIS — G4730 Sleep apnea, unspecified: Secondary | ICD-10-CM | POA: Diagnosis not present

## 2015-06-08 DIAGNOSIS — R0683 Snoring: Secondary | ICD-10-CM

## 2015-06-08 DIAGNOSIS — R7303 Prediabetes: Secondary | ICD-10-CM

## 2015-06-08 NOTE — Patient Instructions (Signed)

## 2015-06-08 NOTE — Progress Notes (Signed)
BP 124/84 mmHg  Pulse 110  Temp(Src) 97.8 F (36.6 C) (Oral)  Resp 14  Wt 294 lb (133.358 kg)  SpO2 96%   Subjective:    Patient ID: Austin Fuentes, male    DOB: 1980-04-03, 35 y.o.   MRN: [id number]  HPI: Austin Fuentes is a 35 y.o. male  Chief Complaint  Patient presents with  . Hospitalization Follow-up    admitted on 3/30 discharged 4/4 went to [place] ER for syptoms of depth perception numbness on left side.  Was diagnosed with CVA  . Cerebrovascular Accident    patient currently in physical therapy and has been setup for sleep study.  . Numbness    still having in right side thigh   Patient is here for hospital f/u; new patient to me He had a stroke Admitted to the hospital on March 30th after he had been at work; he suffered an acute ischemic stroke He noticed numbness and vision changes that morning; he actually went to work that day and noticed trouble using the register and with cognition; he would run into things; went to the ER and found to have had a stroke  MRI brain IMPRESSION: 1. Acute ischemic nonhemorrhagic right parietal lobe infarcts without significant mass effect. 2. Additional subcentimeter cortical ischemic nonhemorrhagic left parietal lobe infarct. 3. Remote infarct involving the superior right cerebellar vermis. 4. Mild chronic small vessel ischemic disease.   Echocardiogram Study Conclusions  - Left ventricle: The cavity size was normal. Systolic function was  normal. The estimated ejection fraction was in the range of 60%  to 65%. Wall motion was normal; there were no regional wall  motion abnormalities. Left ventricular diastolic function  parameters were normal. - Aortic valve: There was no regurgitation. - Aortic root: The aortic root was normal in size. - Mitral valve: There was trivial regurgitation. - Right ventricle: The cavity size was normal. Wall thickness was  normal. Systolic function was normal. - Right atrium:  The atrium was normal in size. - Tricuspid valve: There was no regurgitation. - Pulmonic valve: There was no regurgitation. - Pulmonary arteries: Systolic pressure was within the normal  range. - Inferior vena cava: The vessel was normal in size. - Pericardium, extracardiac: There was no pericardial effusion.  Impressions:  - Normal study.   TEE Study Conclusions  - Left ventricle: The cavity size was normal. Wall thickness was  normal. Systolic function was normal. The estimated ejection  fraction was in the range of 55% to 60%. Wall motion was normal;  there were no regional wall motion abnormalities. - Left atrium: No evidence of thrombus in the atrial cavity or  appendage. - Right atrium: No evidence of thrombus in the atrial cavity or  appendage. - Atrial septum: There was a medium-sized, 0.4 cm x 0.3 cm high  secundum atrial septal defect. Doppler and agitated saline  contrast study showed a small bidirectional, but predominantly  left-to-right, atrial level shunt, in the baseline state.  Immediate and marked reversal of shunt is seen with snoring.  There was an atrial septal aneurysm.  he has a PFO/ASD; carotid US did not show any stenosis; he had high chol; started on aspirin, 80 mg atorvastatin He has some tenderness where they put the chip in in the chest, implant site of loop recorder upper left side chest He is doing well with speaking, swallowing, talking; his wife thought it was a little off, but he's good now Writing and typing is a  little off still He is left-handed No confusion or headaches Some numbness of the right hip; going on for a month; worse when laying down; he has not tried ice or heat  Relevant past medical, surgical, family and social history reviewed and updated as indicated Grandfather had a stroke in his early 22's or so  Interim medical history since our last visit reviewed. Allergies and medications reviewed and  updated.  Review of Systems  Per HPI unless specifically indicated above     Objective:    BP 124/84 mmHg  Pulse 110  Temp(Src) 97.8 F (36.6 C) (Oral)  Resp 14  Wt 294 lb (133.358 kg)  SpO2 96%  Wt Readings from Last 3 Encounters:  07/01/15 297 lb (134.718 kg)  06/22/15 298 lb (135.172 kg)  06/21/15 297 lb 6.4 oz (134.9 kg)   body mass index is 47.48 kg/(m^2).   Physical Exam  Constitutional: He appears well-developed and well-nourished. No distress.  HENT:  Head: Normocephalic and atraumatic.  Eyes: EOM are normal. No scleral icterus.  Neck: No thyromegaly present.  Cardiovascular: Normal rate and regular rhythm.   Heart rate under 100 during auscultation  Pulmonary/Chest: Effort normal and breath sounds normal.  Abdominal: Soft. Bowel sounds are normal. He exhibits no distension.  Musculoskeletal: He exhibits no edema.  Neurological: He is alert. No cranial nerve deficit. He exhibits normal muscle tone. Coordination normal.  Skin: Skin is warm and dry. No ecchymosis noted. No pallor.  Psychiatric: He has a normal mood and affect. His behavior is normal. Judgment and thought content normal.   Results for orders placed or performed during the hospital encounter of 05/27/15  Protime-INR  Result Value Ref Range   Prothrombin Time 13.4 11.6 - 15.2 seconds   INR 1.00 0.00 - 1.49  APTT  Result Value Ref Range   aPTT 32 24 - 37 seconds  CBC  Result Value Ref Range   WBC 11.3 (H) 4.0 - 10.5 K/uL   RBC 5.48 4.22 - 5.81 MIL/uL   Hemoglobin 14.1 13.0 - 17.0 g/dL   HCT 16.1 09.6 - 04.5 %   MCV 79.6 78.0 - 100.0 fL   MCH 25.7 (L) 26.0 - 34.0 pg   MCHC 32.3 30.0 - 36.0 g/dL   RDW 40.9 81.1 - 91.4 %   Platelets 340 150 - 400 K/uL  Differential  Result Value Ref Range   Neutrophils Relative % 58 %   Neutro Abs 6.6 1.7 - 7.7 K/uL   Lymphocytes Relative 35 %   Lymphs Abs 4.0 0.7 - 4.0 K/uL   Monocytes Relative 4 %   Monocytes Absolute 0.5 0.1 - 1.0 K/uL   Eosinophils  Relative 2 %   Eosinophils Absolute 0.3 0.0 - 0.7 K/uL   Basophils Relative 0 %   Basophils Absolute 0.0 0.0 - 0.1 K/uL  Comprehensive metabolic panel  Result Value Ref Range   Sodium 139 135 - 145 mmol/L   Potassium 3.6 3.5 - 5.1 mmol/L   Chloride 104 101 - 111 mmol/L   CO2 26 22 - 32 mmol/L   Glucose, Bld 120 (H) 65 - 99 mg/dL   BUN 9 6 - 20 mg/dL   Creatinine, Ser 7.82 0.61 - 1.24 mg/dL   Calcium 9.2 8.9 - 95.6 mg/dL   Total Protein 7.0 6.5 - 8.1 g/dL   Albumin 3.8 3.5 - 5.0 g/dL   AST 25 15 - 41 U/L   ALT 26 17 - 63 U/L   Alkaline Phosphatase  64 38 - 126 U/L   Total Bilirubin 0.6 0.3 - 1.2 mg/dL   GFR calc non Af Amer >60 >60 mL/min   GFR calc Af Amer >60 >60 mL/min   Anion gap 9 5 - 15  Troponin I  Result Value Ref Range   Troponin I <0.03 <0.031 ng/mL  Hemoglobin A1c  Result Value Ref Range   Hgb A1c MFr Bld 5.7 (H) 4.8 - 5.6 %   Mean Plasma Glucose 117 mg/dL  Lipid panel  Result Value Ref Range   Cholesterol 214 (H) 0 - 200 mg/dL   Triglycerides 161 (H) <150 mg/dL   HDL 29 (L) >09 mg/dL   Total CHOL/HDL Ratio 7.4 RATIO   VLDL 62 (H) 0 - 40 mg/dL   LDL Cholesterol 604 (H) 0 - 99 mg/dL  Glucose, capillary  Result Value Ref Range   Glucose-Capillary 92 65 - 99 mg/dL   Comment 1 Notify RN    Comment 2 Document in Chart   Lupus anticoagulant panel  Result Value Ref Range   PTT Lupus Anticoagulant 34.9 0.0 - 43.6 sec   DRVVT 22.4 0.0 - 44.0 sec   Lupus Anticoag Interp Comment:   Beta-2-glycoprotein i abs, IgG/M/A  Result Value Ref Range   Beta-2 Glyco I IgG <9 0 - 20 GPI IgG units   Beta-2-Glycoprotein I IgM <9 0 - 32 GPI IgM units   Beta-2-Glycoprotein I IgA <9 0 - 25 GPI IgA units  Homocysteine, serum  Result Value Ref Range   Homocysteine 9.6 0.0 - 15.0 umol/L  Cardiolipin antibodies, IgG, IgM, IgA  Result Value Ref Range   Anticardiolipin IgG <9 0 - 14 GPL U/mL   Anticardiolipin IgM <9 0 - 12 MPL U/mL   Anticardiolipin IgA <9 0 - 11 APL U/mL   Sedimentation rate  Result Value Ref Range   Sed Rate 32 (H) 0 - 16 mm/hr  C-reactive protein  Result Value Ref Range   CRP 0.7 <1.0 mg/dL  Alpha galactosidase  Result Value Ref Range   Alpha galactosidase, serum 59.8 28.0 - 80.0 nmol/hr/mg prt   Interpretation Comment    Director Review Comment    Methodology Comment   ANA, IFA (with reflex)  Result Value Ref Range   ANA Ab, IFA Negative   CBC with Differential/Platelet  Result Value Ref Range   WBC 8.7 4.0 - 10.5 K/uL   RBC 5.18 4.22 - 5.81 MIL/uL   Hemoglobin 13.4 13.0 - 17.0 g/dL   HCT 54.0 98.1 - 19.1 %   MCV 80.1 78.0 - 100.0 fL   MCH 25.9 (L) 26.0 - 34.0 pg   MCHC 32.3 30.0 - 36.0 g/dL   RDW 47.8 29.5 - 62.1 %   Platelets 292 150 - 400 K/uL   Neutrophils Relative % 54 %   Neutro Abs 4.7 1.7 - 7.7 K/uL   Lymphocytes Relative 34 %   Lymphs Abs 2.9 0.7 - 4.0 K/uL   Monocytes Relative 8 %   Monocytes Absolute 0.7 0.1 - 1.0 K/uL   Eosinophils Relative 4 %   Eosinophils Absolute 0.3 0.0 - 0.7 K/uL   Basophils Relative 0 %   Basophils Absolute 0.0 0.0 - 0.1 K/uL  Basic metabolic panel  Result Value Ref Range   Sodium 138 135 - 145 mmol/L   Potassium 3.9 3.5 - 5.1 mmol/L   Chloride 103 101 - 111 mmol/L   CO2 27 22 - 32 mmol/L   Glucose, Bld 96 65 -  99 mg/dL   BUN 9 6 - 20 mg/dL   Creatinine, Ser 1.61 0.61 - 1.24 mg/dL   Calcium 9.0 8.9 - 09.6 mg/dL   GFR calc non Af Amer >60 >60 mL/min   GFR calc Af Amer >60 >60 mL/min   Anion gap 8 5 - 15  Glucose, capillary  Result Value Ref Range   Glucose-Capillary 101 (H) 65 - 99 mg/dL   Comment 1 Notify RN    Comment 2 Document in Chart   Glucose, capillary  Result Value Ref Range   Glucose-Capillary 105 (H) 65 - 99 mg/dL  Glucose, capillary  Result Value Ref Range   Glucose-Capillary 107 (H) 65 - 99 mg/dL   Comment 1 Notify RN    Comment 2 Document in Chart   Glucose, capillary  Result Value Ref Range   Glucose-Capillary 108 (H) 65 - 99 mg/dL   Comment 1 Notify  RN    Comment 2 Document in Chart   Glucose, capillary  Result Value Ref Range   Glucose-Capillary 101 (H) 65 - 99 mg/dL  Glucose, capillary  Result Value Ref Range   Glucose-Capillary 81 65 - 99 mg/dL   Comment 1 Notify RN    Comment 2 Document in Chart   Glucose, capillary  Result Value Ref Range   Glucose-Capillary 97 65 - 99 mg/dL   Comment 1 Notify RN    Comment 2 Document in Chart   Glucose, capillary  Result Value Ref Range   Glucose-Capillary 92 65 - 99 mg/dL   Comment 1 Notify RN    Comment 2 Document in Chart   Glucose, capillary  Result Value Ref Range   Glucose-Capillary 91 65 - 99 mg/dL  CBC  Result Value Ref Range   WBC 9.3 4.0 - 10.5 K/uL   RBC 5.31 4.22 - 5.81 MIL/uL   Hemoglobin 13.8 13.0 - 17.0 g/dL   HCT 04.5 40.9 - 81.1 %   MCV 79.5 78.0 - 100.0 fL   MCH 26.0 26.0 - 34.0 pg   MCHC 32.7 30.0 - 36.0 g/dL   RDW 91.4 78.2 - 95.6 %   Platelets 293 150 - 400 K/uL  Basic metabolic panel  Result Value Ref Range   Sodium 139 135 - 145 mmol/L   Potassium 4.1 3.5 - 5.1 mmol/L   Chloride 105 101 - 111 mmol/L   CO2 27 22 - 32 mmol/L   Glucose, Bld 94 65 - 99 mg/dL   BUN 10 6 - 20 mg/dL   Creatinine, Ser 2.13 0.61 - 1.24 mg/dL   Calcium 9.0 8.9 - 08.6 mg/dL   GFR calc non Af Amer >60 >60 mL/min   GFR calc Af Amer >60 >60 mL/min   Anion gap 7 5 - 15  Glucose, capillary  Result Value Ref Range   Glucose-Capillary 79 65 - 99 mg/dL   Comment 1 Notify RN    Comment 2 Document in Chart   Glucose, capillary  Result Value Ref Range   Glucose-Capillary 99 65 - 99 mg/dL   Comment 1 Notify RN    Comment 2 Document in Chart   Glucose, capillary  Result Value Ref Range   Glucose-Capillary 99 65 - 99 mg/dL   Comment 1 Notify RN    Comment 2 Document in Chart   Glucose, capillary  Result Value Ref Range   Glucose-Capillary 95 65 - 99 mg/dL   Comment 1 Notify RN    Comment 2 Document in Chart  Glucose, capillary  Result Value Ref Range   Glucose-Capillary  114 (H) 65 - 99 mg/dL   Comment 1 Notify RN    Comment 2 Document in Chart   Glucose, capillary  Result Value Ref Range   Glucose-Capillary 123 (H) 65 - 99 mg/dL   Comment 1 Notify RN    Comment 2 Document in Chart   Glucose, capillary  Result Value Ref Range   Glucose-Capillary 86 65 - 99 mg/dL   Comment 1 Notify RN    Comment 2 Document in Chart   Glucose, capillary  Result Value Ref Range   Glucose-Capillary 126 (H) 65 - 99 mg/dL   Comment 1 Document in Chart   I-stat troponin, ED ([facility])  Result Value Ref Range   Troponin i, poc 0.00 0.00 - 0.08 ng/mL   Comment 3          I-Stat Chem 8, ED  ([facility])  Result Value Ref Range   Sodium 142 135 - 145 mmol/L   Potassium 4.0 3.5 - 5.1 mmol/L   Chloride 102 101 - 111 mmol/L   BUN 13 6 - 20 mg/dL   Creatinine, Ser 4.781.00 0.61 - 1.24 mg/dL   Glucose, Bld 295109 (H) 65 - 99 mg/dL   Calcium, Ion 6.211.13 3.081.12 - 1.23 mmol/L   TCO2 30 0 - 100 mmol/L   Hemoglobin 15.3 13.0 - 17.0 g/dL   HCT 65.745.0 84.639.0 - 96.252.0 %  CBG monitoring, ED  Result Value Ref Range   Glucose-Capillary 121 (H) 65 - 99 mg/dL  Echocardiogram  Result Value Ref Range   Weight 4672 oz   Height 66 in   BP 110/64 mmHg      Assessment & Plan:   Problem List Items Addressed This Visit      Cardiovascular and Mediastinum   CVA (cerebral vascular accident) (HCC) - Primary    Doing remarkably well after stroke; on 325 mg aspirin; full work-up done in hospital; will f/u with neurologist and cardiologist      PFO (patent foramen ovale)    On aspirin        Other   Severe obesity (BMI >= 40) (HCC)    Encouraged weight loss; see AVS      Hyperlipidemia    Limit saturated fats; continue statin; fish or chicken preferable; recheck lipids in 6 weeks; weight loss      Prediabetes    Encouraged weight loss and limiting sugary drinks and white bread/rice/potatoes; check A1c in 6 months         Follow up plan: Return 6 weeks and 6 months, for  fasting labs and visit each time.  An after-visit summary was printed and given to the patient at check-out.  Please see the patient instructions which may contain other information and recommendations beyond what is mentioned above in the assessment and plan.

## 2015-06-08 NOTE — Assessment & Plan Note (Addendum)
Doing remarkably well after stroke; on 325 mg aspirin; full work-up done in hospital; will f/u with neurologist and cardiologist

## 2015-06-08 NOTE — Assessment & Plan Note (Signed)
Encouraged weight loss and limiting sugary drinks and white bread/rice/potatoes; check A1c in 6 months

## 2015-06-08 NOTE — Assessment & Plan Note (Signed)
On aspirin. °

## 2015-06-08 NOTE — Patient Instructions (Signed)
Return in 6 weeks for fasting labs cholesterol Return in 6 months for cholesterol and prediabetes  Check out the information at familydoctor.org entitled "What It Takes to Lose Weight" Try to lose between 1-2 pounds per week by taking in fewer calories and burning off more calories You can succeed by limiting portions, limiting foods dense in calories and fat, becoming more active, and drinking 8 glasses of water a day (64 ounces) Don't skip meals, especially breakfast, as skipping meals may alter your metabolism Do not use over-the-counter weight loss pills or gimmicks that claim rapid weight loss A healthy BMI (or body mass index) is between 18.5 and 24.9 You can calculate your ideal BMI at the NIH website [employer]huhttp://www.nhlbi.nih.gov/health/educational/lose_wt/BMI/bmicalc.htm

## 2015-06-08 NOTE — Progress Notes (Signed)
SLEEP MEDICINE CLINIC   Provider:  Melvyn Novas, M D  Referring Provider: Judeen Hammans, MD Primary Care Physician:  Ruel Favors, MD  Chief Complaint  Patient presents with  . New Patient (Initial Visit)    never had sleep study, recently hospitalized, snores at night, rm 10, alone    HPI:  Austin Fuentes is a 35 y.o. male , seen here as a referral  from Dr. Gae Gallop for a sleep study and evaluation of risk factors of stroke in the young.   Chief complaint according to patient : " I snore and often have problems to sleep ", wife witnessed apneas.   [name] is a left handed Caucasian, married gentleman, father of 2 girls, who presents here after being hospitalized for stroke in early April. He was seen by the [facility]ad hospitalists after he suffered a stroke, his risk factors for stroke included obesity, hyperlipidemia mainly cholesterol, ostium secundum atrial septal defect, confirmed by TEE. Currently on aspirin and Lipitor 80 mg. Ejection fraction 60-65% without any wall motion abnormality. Prior to his stroke he suffered mainly of obesity, seasonal allergies and plantar fasciitis but on 05/27/2015 he developed numbness to the left side of his body and vision changes. He had gone to bed the night before without any of the symptoms but woke up noting that his left arm felt numb soon after he realized that his left leg and left side of the face were also not much. He took his daughter to school that morning and then reported to work but had difficulty putting money and appropriate compartments of the Ambulance person. And he felt slower in his thinking processes eventually upon his wife's insistence he finally came to the emergency room. He has never been a smoker and he does not drink alcohol and he denies any recreational drug use there was no swelling to the affected number should extremities and his symptoms had not changed from the morning time when he woke up with them.  During his brief hospitalization a cardiac workup as mentioned above was begun as well as a hypercoagulability panel started and meanwhile returned negative. He was discharged with orders to go to physical therapy occupational therapy and speech therapy, physical therapy was soon found not to be necessary.     Sleep habits are as follows: The patient works as an International aid/development worker for [employer] and has very irregular work hours, sometimes she will start at 6 AM other days at 1 PM which is very hard on the family life as well as on his sleep schedule. He usually tries to be in bed by 10 PM, but often is not able to sleep right away. Some nights he will be lying awake all night. His wife has reported that he began snoring soon after falling asleep and she has witnessed apneas. The couple shares the bedroom, his youngest daughter is 56 months old, the older daughter is three-vessel runoff and often joins the parents in their bed. 2 dogs sleep in the master bed room, too.  The bedroom is cool, dark, quiet. There is an illuminated baby monitor with screen. Mr. Kadlec sleeps on 2 pillows and a special neck pillow. He usually starts falling asleep on his side but when waking up is more often on his back. He does not have nocturia, some nights he will wake up , most nights he will sleep through. His wife has not reported to him if there  is a positional component to his snoring. During his day and night in the hospital she found him snoring less because the head of the hospital bed was elevated. Earliest rise time is 5 AM , most mornings its 7 AM. The daughters go to day care from 8 o'clock on. Both, Mr and Mrs Senger work full time.  Most mornings Mr. Overfield will rely on an alarm and he feels not rested or refreshed. Sometimes in a very dry mouth in the morning usually no headaches. Rarely he will nap, but those make him "cranky "- he sleep time is one hour.   Sleep medical history and family sleep  history:  Morbid obesity after high school, was skinny until 3rd grade.  No parent with apnea , 3 brothers, one brain tumor, one with crohn's disease , all without OSA    Social history:  Married, Air cabin crew, 2 daughters,     Other sources; recent stroke admission HISTORY OF PRESENT ILLNESS HOLDEN Austin Fuentes is a 35 y.o. male with No past medical history who presents with abnormal sensation of depth perception being off on his left side that started this morning. He states that he was last normal prior to bed last night and subsequently on awakening this morning he reached for his phone and felt like his "left arm felt like jelly." throughout the day he has been reaching for things and they have not been where he expected them to be. Finally, his wife convinced him to come in and be checked out and a head CT revealed a hypodensity in the right parietal region consistent with infarct.  He denies any recent illnesses other than slight cough for the past couple of weeks. He denies headaches, denies neck pain.  LKW: 3/29 prior to bed tpa given?: no, Out of window Premorbid modified rankin scale: 0  CBC:   Recent Labs Lab 05/27/15 2259  05/29/15 0402 05/31/15 0230  WBC 11.3*  --  8.7 9.3  NEUTROABS 6.6  --  4.7  --   HGB 14.1  < > 13.4 13.8  HCT 43.6  < > 41.5 42.2  MCV 79.6  --  80.1 79.5  PLT 340  --  292 293  < > = values in this interval not displayed.  Basic Metabolic Panel:   Recent Labs Lab 05/29/15 0402 05/31/15 0230  NA 138 139  K 3.9 4.1  CL 103 105  CO2 27 27  GLUCOSE 96 94  BUN 9 10  CREATININE 0.89 0.89  CALCIUM 9.0 9.0    Lipid Panel:     Component Value Date/Time   CHOL 214* 05/28/2015 0551   TRIG 309* 05/28/2015 0551   HDL 29* 05/28/2015 0551   CHOLHDL 7.4 05/28/2015 0551   VLDL 62* 05/28/2015 0551   LDLCALC 123* 05/28/2015 0551   HgbA1c:  Lab Results  Component Value Date   HGBA1C 5.7* 05/28/2015  The patient was tested for Fabry disease  which returned today and negative. forwarded to Dr Roda Shutters.   Review of Systems: Out of a complete 14 system review, the patient complains of only the following symptoms, and all other reviewed systems are negative. Snoring, shift worker,   Epworth score  9  How likely are you to doze in the following situations: 0 = not likely, 1 = slight chance, 2 = moderate chance, 3 = high chance  Sitting and Reading? 2 Watching Television?2 Sitting inactive in a public place (theater or meeting)?2 Lying down in the  afternoon when circumstances permit?1 Sitting and talking to someone?1 Sitting quietly after lunch without alcohol?1 In a car, while stopped for a few minutes in traffic?0 As a passenger in a car for an hour without a break?1  Total =9    , Fatigue severity score 29   , depression score N/a   Social History   Social History  . Marital Status: Married    Spouse Name: N/A  . Number of Children: N/A  . Years of Education: N/A   Occupational History  . Not on file.   Social History Main Topics  . Smoking status: Never Smoker   . Smokeless tobacco: Never Used  . Alcohol Use: No  . Drug Use: No  . Sexual Activity: No   Other Topics Concern  . Not on file   Social History Narrative    Family History  Problem Relation Age of Onset  . Stroke Maternal Grandfather     Past Medical History  Diagnosis Date  . Allergy   . Stroke [facility])     Past Surgical History  Procedure Laterality Date  . Tonsillectomy    . Tee without cardioversion N/A 05/31/2015    Procedure: TRANSESOPHAGEAL ECHOCARDIOGRAM (TEE);  Surgeon: Thurmon Fair, MD;  Location: [facility] ENDOSCOPY;  Service: Cardiovascular;  Laterality: N/A;  . Ep implantable device N/A 05/31/2015    Procedure: Loop Recorder Insertion;  Surgeon: Thurmon Fair, MD;  Location: [facility] INVASIVE CV LAB;  Service: Cardiovascular;  Laterality: N/A;  . Tonsillectomy      Current Outpatient Prescriptions  Medication Sig Dispense Refill  .  aspirin 325 MG tablet Take 1 tablet (325 mg total) by mouth daily. 30 tablet 0  . atorvastatin (LIPITOR) 80 MG tablet Take 1 tablet (80 mg total) by mouth daily at 6 PM. 60 tablet 0  . senna-docusate (SENOKOT-S) 8.6-50 MG tablet Take 1 tablet by mouth at bedtime as needed for mild constipation or moderate constipation. 10 tablet 0   No current facility-administered medications for this visit.    Allergies as of 06/08/2015  . (No Known Allergies)    Vitals: BP 118/86 mmHg  Pulse 82  Resp 20  Ht  (1.676 m)  Wt 292 lb (132.45 kg)  BMI 47.15 kg/m2 Last Weight:  Wt Readings from Last 1 Encounters:  06/08/15 292 lb (132.45 kg)   ZOX:WRUE mass index is 47.15 kg/(m^2).     Last Height:   Ht Readings from Last 1 Encounters:  06/08/15  (1.676 m)    Physical exam:  General: The patient is awake, alert and appears not in acute distress. The patient is well groomed. Head: Normocephalic, atraumatic. Neck is supple. Mallampati 4 ,  neck circumference:18.5 . Nasal airflow not patent , TMJ is  evident . Retrognathia is seen.  Full facial hair.  Cardiovascular:  Regular rate and rhythm, without  murmurs or carotid bruit, and without distended neck veins. Respiratory: Lungs are clear to auscultation. Skin:  Without evidence of edema, or rash Trunk: BMI is elevated . The patient's posture ; droopy shoulders   Neurologic exam : The patient is awake and alert, oriented to place and time.    Attention span & concentration ability appears normal.  Speech is fluent,  without  dysarthria, dysphonia or aphasia.  Mood and affect are appropriate.  Cranial nerves: Pupils are equal and briskly reactive to light. Funduscopic exam without  evidence of pallor or edema. Extraocular movements  in vertical and horizontal planes intact and without nystagmus.  Visual fields by finger perimetry are intact. Hearing to finger rub intact.   Facial sensation intact to fine touch.  Facial motor strength is  symmetric and tongue and uvula move midline. Shoulder shrug was symmetrical.   Motor exam:  Normal tone, muscle bulk and symmetric strength in all extremities. Mr. Brooking grip strength is bilaterally good, there is no sign of weakness in his upper extremities and his face is symmetric. Sensory:  Fine touch, pinprick and vibration were tested in all extremities. Proprioception tested in the upper extremities was normal. Numbness resolved.  Coordination: Rapid alternating movements in the fingers/hands was normal. Finger-to-nose maneuver  normal without evidence of ataxia, dysmetria or tremor. Gait and station: Patient walks without assistive device and is able unassisted to climb up to the exam table. Strength within normal limits.  Stance is stable and normal.   Romberg testing is  negative.  Deep tendon reflexes: in the  upper and lower extremities are symmetric and intact. Babinski maneuver response is  downgoing.    Dear Dr. Pearlean Brownie, The patient was advised of the nature of the diagnosed sleep disorder , the treatment options and risks for general a health and wellness arising from not treating the condition.  I spent more than 40 minutes of face to face time with the patient. Greater than 50% of time was spent in counseling and coordination of care. We have discussed the diagnosis and differential and I answered the patient's questions.     Assessment:  After physical and neurologic examination, review of laboratory studies,  Personal review of imaging studies, reports of other /same  Imaging studies ,  Results of polysomnography/ neurophysiology testing and pre-existing records as far as provided in visit., my assessment is   Mr. Imes was diagnosed with a rather smaller stroke in the right parietal lobe the impression was that this could be subacute. 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 The MRI confirmed a remote infarct in the superior right  cerebellar vermis, a restricted diffusion within the right parietal lobe which overall is consistent with an acute infarction. And a 6 mm cortical infarct in the left parietal lobe. Hyperlipidemia, hypercoagulability panel and genetic factors have already been evaluated.  1)  associated risk factors with stroke in the young are undetected obstructive sleep apnea, this can lead to atrial fibrillation at night, most strokes occur overnight and are only found symptomatic in the morning. The patient will continue to take his aspirin and he is still in the workup, recently had a cardiac monitor implanted. We will obtain an attended sleep study with a goal to split the study should apnea be identified. I would appreciate if we can get an hour of supine sleep at baseline. Special attention will be paid to the oxygen saturation. The patient's main risk factors are his very high body mass index, his high-grade Mallampati and is large neck circumference in the setting of retrognathia.  2) his wife has noticed snoring and apnea and more than once as the patient spent the night on the sofa.  3) the patient also reports insomnia which may be related to his constantly changing work schedules. He is in a similar situation as a jet black person. His day-to-day work schedule changes drastically in his sleep needs and schedule have to adjust.    Plan:  Treatment plan and additional workup : SPLIT , with special attention to SP02 desat. Please document oxygen use if the patient is  severly hypoxic. I will see the patient after the sleep study is completed, and will forward a copy to Dr. Pearlean BrownieSethi, primary neurologist and Dr Carlynn PurlSowles as PCP.  Porfirio Mylararmen Marlisa Caridi MD  06/08/2015   CC: Judeen HammansMeredith [place], Md [address] [place], [place] [id number]

## 2015-06-08 NOTE — Assessment & Plan Note (Signed)
Limit saturated fats; continue statin; fish or chicken preferable; recheck lipids in 6 weeks; weight loss

## 2015-06-09 ENCOUNTER — Ambulatory Visit (INDEPENDENT_AMBULATORY_CARE_PROVIDER_SITE_OTHER): Payer: BLUE CROSS/BLUE SHIELD | Admitting: *Deleted

## 2015-06-09 DIAGNOSIS — I6349 Cerebral infarction due to embolism of other cerebral artery: Secondary | ICD-10-CM

## 2015-06-09 LAB — CUP PACEART INCLINIC DEVICE CHECK: Date Time Interrogation Session: 20170412142614

## 2015-06-09 NOTE — Progress Notes (Signed)
Wound check appointment. Steri-strips previously removed by patient. Wound without redness or edema. Incision edges approximated, wound well healed. Loop check in clinic. Battery status: ok. R-waves 0.374mV. 0 symptom episodes, 0 tachy episodes, 0 pause episodes, 0 brady episodes. 0 AF episodes. Monthly summary reports and ROV with [facility] PRN

## 2015-06-11 ENCOUNTER — Ambulatory Visit: Payer: BLUE CROSS/BLUE SHIELD | Admitting: Speech Pathology

## 2015-06-11 ENCOUNTER — Ambulatory Visit: Payer: BLUE CROSS/BLUE SHIELD | Admitting: Occupational Therapy

## 2015-06-11 DIAGNOSIS — R278 Other lack of coordination: Secondary | ICD-10-CM

## 2015-06-11 DIAGNOSIS — R41841 Cognitive communication deficit: Secondary | ICD-10-CM

## 2015-06-11 NOTE — Therapy (Deleted)
[facility] [address] [place], [place], [address] Phone: [phone]   Fax:  [phone]  June 11, 2015    '[email or web address]' @  Occupational Therapy Discharge Summary   Patient: [name] MRN: [id number] Date of Birth: February 05, 1981  Diagnosis: Other lack of coordination  Referring Provider: Dr. Ancil Boozer  The above patient had been seen in Occupational Therapy 2 times of OT treatments scheduled with 0 no shows and 0 cancellations.  The treatment consisted of Self-care, neuro muscular re-ed, ther. ex, and pt. Ed. The patient is: {improved/worse/unchanged:3041574} improved  Subjective: Pt. Reports returning back to work this week. Pt. Reports feeling as though he has improved, and is back to baseline with ADL/IADLs except typing.  Discharge Findings:Pt. Is back to baseline with daily ADL/IADL self-care tasks.  MAM-20 score: 100.  Typing speed for a 3 sentence paragraph is 3 min. & 35 sec.  Functional Status at Discharge: Pt. Has scored a 100 on the MAM-20. Pt. Was provided with a HEP and written handout for for theraputty ex. Pt. reports independence with ADL/IADL tasks , and has returned to work full time. Pt. Has met goals except for typing. Pt. Was provided with compensatory strategies for improving typing speed, and accuracy.  {ULAGT:[id number]}      Plan - 06/11/15 0936    Clinical Impression Statement Pt. reports that he is back to his baseline with self-care, and IADL tasks. Pt. MAM-20 score has improved to 100. Pt. continues to ha=ve difficulty with typing speed and accuracy. Pt. was provided with exercises to continue genttle strengthening, and coordintation tasks. Pt. is appropriate for discharge.    Clinical Impairments Affecting Rehab Potential Positive indicators: age, motivation, family support. Negative Indicators: multiple comorbidities.   OT Frequency 1x / week   OT Duration 8 weeks   OT  Treatment/Interventions Self-care/ADL training;Therapeutic exercises;Therapeutic activities;Patient/family education;Visual/perceptual remediation/compensation;Therapeutic exercise;Neuromuscular education;Cognitive remediation/compensation;Energy conservation;DME and/or AE instruction;Manual Therapy   Consulted and Agree with Plan of Care Patient     OT TREATMENT     Neuro muscular re-education: Pt. performed [facility] tasks using the Grooved pegboard. Pt. worked on grasping the grooved pegs from a horizontal position, and moving the pegs to a vertical position in the hand to prepare for placing them in the grooved slot.   Therapeutic Exercise: Pt. performed gross gripping with grip strengthener. Pt. worked on sustaining grip while grasping pegs and reaching at various heights. Pt. Able to complete without complaint. Pt. worked on [employer] for hand exercises with green theraputty. Pt. Was provided with green theraputty and a written handout.  Selfcare: MAM-20 score of 100. Pt. Has met goals except goal #1 for typing. Pt. Worked on typing copying from written text. 3 sentence paragraph in 3 min. & 35 sec. With 10 errors. Pt. Worked on timed and untimed typing. Strategies were reviewed to assist with typing accuracy.    Sincerely, Harrel Carina, MS, OTR/L   Harrel Carina, OT  CC [email or web address]' @  [facility] [address] [place], [place], [address] Phone: [phone]   Fax:  [phone]  Patient: Austin Fuentes MRN: [id number] Date of Birth: 07/02/1980

## 2015-06-11 NOTE — Therapy (Signed)
[place] MAIN [facility] SERVICES [address] [place], [place], [address] Phone: [phone]   Fax:  [phone]  Occupational Therapy Treatment/Discharge Summary  Patient Details  Name: Austin Fuentes MRN: [id number] Date of Birth: 1980/11/24 Referring Provider: Dr. Ancil Boozer  Encounter Date: 06/11/2015      OT End of Session - 06/11/15 0935    Visit Number 2   Number of Visits 8   Date for OT Re-Evaluation 07/30/15   OT Start Time 0825   OT Stop Time 0920   OT Time Calculation (min) 55 min   Activity Tolerance Patient tolerated treatment well   Behavior During Therapy [facility] for tasks assessed/performed      Past Medical History  Diagnosis Date  . Allergy   . Stroke [facility])     Past Surgical History  Procedure Laterality Date  . Tonsillectomy    . Tee without cardioversion N/A 05/31/2015    Procedure: TRANSESOPHAGEAL ECHOCARDIOGRAM (TEE);  Surgeon: Sanda Klein, MD;  Location: [place];  Service: Cardiovascular;  Laterality: N/A;  . Ep implantable device N/A 05/31/2015    Procedure: Loop Recorder Insertion;  Surgeon: Sanda Klein, MD;  Location: [place] CV LAB;  Service: Cardiovascular;  Laterality: N/A;  . Tonsillectomy      There were no vitals filed for this visit.      Subjective Assessment - 06/11/15 0934    Subjective  Pt. reports he has progressed, and feels he is back to baseline except typing.   Pertinent History "Austin Fuentes is a 35 y.o. male with PMH of seasonal allergy and plantar fasciitis who presents to the ED with numbness to the left side of his body and visual disturbance first noted at approximately 6:45 AM on 05/27/2015. Patient takes no prescription medications and typically enjoys good health. He went to bed in his usual state the night of 05/26/2015, and upon waking the following morning he noted his left arm to be numb. He initially thought that he must have slept on the arm, but soon realized that his  left leg and face also had paresthesia. He reports that his visual acuity is intact, but he perceives a problem with his depth perception, bumping into things that he thought he was walking past. Despite his wife's encouragement to seek medical evaluation, he insisted on taking his daughter's to school in the morning and then reporting for work as a Scientist, water quality. He experienced some difficulty putting money in the appropriate compartment in the cash register and noted some mild cognitive slowing while at work. Eventually, upon his wife's insistence, he came into the ED for evaluation. He has never experienced similar symptoms previously and denies any personal or family history of VTE or premature heart attack. He has never smoked, does not drink alcohol, and denies use of illicit drugs. There's been no unilateral leg swelling or tenderness and he has never experienced palpitations   Patient Stated Goals To return to normal.   Currently in Pain? No/denies   Pain Score 4         OT TREATMENT     Neuro muscular re-education: Pt. performed [facility] tasks using the Grooved pegboard. Pt. worked on grasping the grooved pegs from a horizontal position, and moving the pegs to a vertical position in the hand to prepare for placing them in the grooved slot.   Therapeutic Exercise: Pt. performed gross gripping with grip strengthener. Pt. worked on sustaining grip while grasping pegs and reaching at various heights.  Pt. Able to complete without complaint. Pt. worked on [employer] for hand exercises with green theraputty. Pt. Was provided with green theraputty and a written handout.  Selfcare: MAM-20 score of 100. Pt. Has met goals except goal #1 for typing. Pt. Worked on typing copying from written text. 3 sentence paragraph in 3 min. & 35 sec. With 10 errors. Pt. Worked on timed and untimed typing. Strategies were reviewed to assist with typing accuracy.                                OT Long Term  Goals - 06/11/15 1022    OT LONG TERM GOAL #1   Title Pt. will increased Left [place] by 3 sec. of speed to assist with typing.   Baseline Pt. has difficulty   Time 8   Period Weeks   Status Not Met   OT LONG TERM GOAL #2   Title Pt. will write a 4 lined paragraph with 100% legibility.   Baseline  Short phrases: 75% legible   Time 8   Period Weeks   Status Achieved   OT LONG TERM GOAL #3   Title Pt. will independently, accurately, and efficiently calculate math and change needed in preparation for grocery store transactions.   Baseline Pt. has difficulty    Time 8   Period Weeks   Status Achieved   OT LONG TERM GOAL #4   Title Pt. will  independently cut veggies.   Baseline Pt. has difficulty   Time 8   Period Weeks   Status Achieved   OT LONG TERM GOAL #5   Title Pt. will independently stack fold and neatly stack laundry.   Baseline Pt. has difficulty making neat clothing stacks.   Time 8   Period Weeks   Status Achieved               Plan - 06/11/15 0936    Clinical Impression Statement Pt. reports that he is back to his baseline with self-care, and IADL tasks. Pt. MAM-20 score has improved to 100. Pt. continues to ha=ve difficulty with typing speed and accuracy. Pt. was provided with exercises to continue genttle strengthening, and coordintation tasks. Pt. is appropriate for discharge.    Clinical Impairments Affecting Rehab Potential Positive indicators: age, motivation, family support. Negative Indicators: multiple comorbidities.   OT Frequency 1x / week   OT Duration 8 weeks   OT Treatment/Interventions Self-care/ADL training;Therapeutic exercises;Therapeutic activities;Patient/family education;Visual/perceptual remediation/compensation;Therapeutic exercise;Neuromuscular education;Cognitive remediation/compensation;Energy conservation;DME and/or AE instruction;Manual Therapy   Consulted and Agree with Plan of Care Patient      Patient will benefit from skilled  therapeutic intervention in order to improve the following deficits and impairments:  Decreased strength, Impaired UE functional use, Decreased coordination, Decreased activity tolerance, Decreased range of motion, Decreased balance, Impaired vision/preception, Impaired perceived functional ability, Pain, Decreased endurance, Decreased knowledge of use of DME  Visit Diagnosis: Other lack of coordination    Problem List Patient Active Problem List   Diagnosis Date Noted  . Prediabetes 06/08/2015  . Ostium secundum atrial septal defect   . CVA (cerebral vascular accident) ([place]) 05/28/2015  . Acute ischemic stroke ([place]) 05/28/2015  . Needs sleep apnea assessment 05/28/2015  . Severe obesity (BMI >= 40) ([place]) 05/28/2015  . Hyperlipidemia 05/28/2015  . Cerebral infarction due to unspecified mechanism   . PFO (patent foramen ovale)   . History of stroke    Harrel Carina,  MS, OTR/L  Harrel Carina 06/11/2015, 10:22 AM  [place] MAIN [facility] SERVICES [address] [place], [place], [address] Phone: [phone]   Fax:  [phone]  Name: ORVILL COULTHARD MRN: [id number] Date of Birth: 07-24-1980 .

## 2015-06-11 NOTE — Therapy (Signed)
[place] MAIN [facility] SERVICES [address] [place], [place], [address] Phone: [phone]   Fax:  [phone]  Speech Language Pathology Discharge Summary  Patient Details  Name: Austin Fuentes MRN: [id number] Date of Birth: 1980-12-13 Referring Provider: Dr. Ancil Boozer  Encounter Date: 06/11/2015    Past Medical History  Diagnosis Date  . Allergy   . Stroke [facility]     Past Surgical History  Procedure Laterality Date  . Tonsillectomy    . Tee without cardioversion N/A 05/31/2015    Procedure: TRANSESOPHAGEAL ECHOCARDIOGRAM (TEE);  Surgeon: Sanda Klein, MD;  Location: [place];  Service: Cardiovascular;  Laterality: N/A;  . Ep implantable device N/A 05/31/2015    Procedure: Loop Recorder Insertion;  Surgeon: Sanda Klein, MD;  Location: [place] CV LAB;  Service: Cardiovascular;  Laterality: N/A;  . Tonsillectomy      There were no vitals filed for this visit.     The patient returned for his follow up treatment session.  The patient states that he was able to function well at work and feels that all cognitive-communication issues are resolved.  His goals are met and he is discharge from SLP.              SLP Long Term Goals - 06/11/15 0935    SLP LONG TERM GOAL #1   Title Patient will demonstrate functional cognitive-communication skills for successful return to work.   Status Achieved   SLP LONG TERM GOAL #2   Title Patient will demonstrate functional cognitive-communication skills for independent completion of personal responsibilities.   Status Achieved        Patient will benefit from skilled therapeutic intervention in order to improve the following deficits and impairments:   Cognitive communication deficit    Problem List Patient Active Problem List   Diagnosis Date Noted  . Prediabetes 06/08/2015  . Ostium secundum atrial septal defect   . CVA (cerebral vascular accident) ([place]) 05/28/2015  . Acute  ischemic stroke ([place]) 05/28/2015  . Needs sleep apnea assessment 05/28/2015  . Severe obesity (BMI >= 40) ([place]) 05/28/2015  . Hyperlipidemia 05/28/2015  . Cerebral infarction due to unspecified mechanism   . PFO (patent foramen ovale)   . History of stroke    Leroy Sea, MS/CCC- SLP  Lou Miner 06/11/2015, 9:36 AM  [place] MAIN [facility] SERVICES [address] [place], [place], [address] Phone: [phone]   Fax:  [phone]   Name: Austin Fuentes MRN: [id number] Date of Birth: March 14, 1980

## 2015-06-12 ENCOUNTER — Telehealth: Payer: Self-pay | Admitting: Physician Assistant

## 2015-06-12 ENCOUNTER — Emergency Department (HOSPITAL_COMMUNITY)
Admission: EM | Admit: 2015-06-12 | Discharge: 2015-06-12 | Disposition: A | Discharge status: Home / Self Care | Payer: BLUE CROSS/BLUE SHIELD | Attending: Emergency Medicine | Admitting: Emergency Medicine

## 2015-06-12 ENCOUNTER — Encounter (HOSPITAL_COMMUNITY): Payer: Self-pay | Admitting: *Deleted

## 2015-06-12 DIAGNOSIS — Z7982 Long term (current) use of aspirin: Secondary | ICD-10-CM | POA: Diagnosis not present

## 2015-06-12 DIAGNOSIS — Z8673 Personal history of transient ischemic attack (TIA), and cerebral infarction without residual deficits: Secondary | ICD-10-CM | POA: Insufficient documentation

## 2015-06-12 DIAGNOSIS — R55 Syncope and collapse: Secondary | ICD-10-CM | POA: Diagnosis present

## 2015-06-12 LAB — BASIC METABOLIC PANEL
ANION GAP: 6 (ref 5–15)
BUN: 9 mg/dL (ref 6–20)
CHLORIDE: 107 mmol/L (ref 101–111)
CO2: 27 mmol/L (ref 22–32)
Calcium: 9.1 mg/dL (ref 8.9–10.3)
Creatinine, Ser: 0.96 mg/dL (ref 0.61–1.24)
GFR calc Af Amer: >60 mL/min (ref >60)
GFR calc non Af Amer: >60 mL/min (ref >60)
Glucose, Bld: 100 mg/dL — ABNORMAL HIGH (ref 65–99)
Potassium: 3.6 mmol/L (ref 3.5–5.1)
SODIUM: 140 mmol/L (ref 135–145)

## 2015-06-12 LAB — CBC
HCT: 40.9 % (ref 39.0–52.0)
HEMOGLOBIN: 12.9 g/dL — AB (ref 13.0–17.0)
MCH: 24.9 pg — AB (ref 26.0–34.0)
MCHC: 31.5 g/dL (ref 30.0–36.0)
MCV: 79 fL (ref 78.0–100.0)
PLATELETS: 276 10*3/uL (ref 150–400)
RBC: 5.18 MIL/uL (ref 4.22–5.81)
RDW: 13.2 % (ref 11.5–15.5)
WBC: 8.5 10*3/uL (ref 4.0–10.5)

## 2015-06-12 LAB — URINALYSIS, ROUTINE W REFLEX MICROSCOPIC
Bilirubin Urine: NEGATIVE
Glucose, UA: NEGATIVE mg/dL
HGB URINE DIPSTICK: NEGATIVE
Ketones, ur: NEGATIVE mg/dL
LEUKOCYTES UA: NEGATIVE
Nitrite: NEGATIVE
PROTEIN: NEGATIVE mg/dL
SPECIFIC GRAVITY, URINE: 1.025 (ref 1.005–1.030)
pH: 7.5 (ref 5.0–8.0)

## 2015-06-12 NOTE — Discharge Instructions (Signed)

## 2015-06-12 NOTE — ED Notes (Signed)
Interrogated loop recorder by the triage rn

## 2015-06-12 NOTE — ED Notes (Signed)
Pt given water for PO challenge 

## 2015-06-12 NOTE — ED Notes (Signed)
Pt waiting on the results of his loop recorder   Still waiting.  Call will be made to get the results

## 2015-06-12 NOTE — ED Notes (Signed)
PT states had stroke 05/27/15 and states that he is no longer having any stroke residuals.  Pt states passed out yesterday about 6pm when he was standing and laughing and felt like he had to cough and fell to ground face forward.

## 2015-06-12 NOTE — ED Notes (Signed)
Pt left with urinal to try and provide urine sample. He stated that after water he should be able to urinate.

## 2015-06-12 NOTE — ED Notes (Signed)
The pts loop  Was interroagted shortly after he arrived  However after numerus calls we have not gotten a readout.  For this pt..  i have now interrogated the medtronic loop recorder again and sent it again.  We are still waiting on the   Read out.    The pt has not had anyother  Episodes since his arrival

## 2015-06-12 NOTE — ED Notes (Signed)
Interrogated loop recorder and transmitted

## 2015-06-12 NOTE — Telephone Encounter (Signed)
Paged by answering service, The patient had a 10 sec syncope episode yesterday. He was laughing with friends and suddenly become dizzy and passed out. He hit his face on floor. Calling for work today -->stating what to do regarding syncope. Recently had loop recorder placement. He is complaining of headache and painful bruise on face. No weakness or blurred vision. No chest pain and sob. Advise to go to [facility] ER for further evaluation--> the patient agrees to plan. Will need devise check. Case discussed with Dr. Royann Shiversroitoru who placed loop recorder.   Kateland Leisinger, PAC

## 2015-06-12 NOTE — ED Notes (Signed)
The pt has had a recent stroke.  A loop recorder was placed to determine the cause.  Yesterday he fainted while standing talking.  He has pain in his lt hand and a headache since the fall  He may have struck his head and he had a loc  At present alert oriented skin warm and dry   Wife remains at the bedside

## 2015-06-12 NOTE — ED Provider Notes (Signed)
CSN: [id number]     Arrival date & time 06/12/15  1458 History   First MD Initiated Contact with Patient 06/12/15 1529     Chief Complaint  Patient presents with  . Loss of Consciousness     (Consider location/radiation/quality/duration/timing/severity/associated sxs/prior Treatment) HPI   Austin Fuentes is a 35 y.o. male here for evaluation of an episode of syncope which occurred yesterday. The patient was standing, side, with his wife when he felt "dizzy and hot." His wife states that he then slumped onto her, and then fell to the ground face first. He injured the bridge of his nose in the fall, while he was wearing his glasses. His wife states that he was unconscious for 5-10 seconds. After that, he immediately awoke, and acted normal. There was no shaking. There was no prolonged after effective. Following the episode of syncope. He denies recent fever, chills, nausea, vomiting, cough, shortness of breath, chest pain, change in bowel or urinary habits. He is taking his medications as directed. He recently had a loop recorder placed, after evaluation revealed a ischemic stroke. He reported the syncopal episode, by activating his loop recorder, but has not heard anything about the events. He decided to come here today after consulting with a cardiology PA, regarding the event. There've been no other episodes of syncope. He otherwise feels well. He is planning to follow-up with neurology and cardiology and had a sleep study performed. There are no other known modifying factors.   Past Medical History  Diagnosis Date  . Allergy   . Stroke [facility](HCC)    Past Surgical History  Procedure Laterality Date  . Tonsillectomy    . Tee without cardioversion N/A 05/31/2015    Procedure: TRANSESOPHAGEAL ECHOCARDIOGRAM (TEE);  Surgeon: Thurmon FairMihai Croitoru, MD;  [facility];  Service: Cardiovascular;  Laterality: N/A;  . Ep implantable device N/A 05/31/2015    Procedure: Loop Recorder Insertion;  Surgeon:  Thurmon FairMihai Croitoru, MD;  [facility];  Service: Cardiovascular;  Laterality: N/A;  . Tonsillectomy     Family History  Problem Relation Age of Onset  . Stroke Maternal Grandfather    Social History  Substance Use Topics  . Smoking status: Never Smoker   . Smokeless tobacco: Never Used  . Alcohol Use: No    Review of Systems  All other systems reviewed and are negative.     Allergies  Review of patient's allergies indicates no known allergies.  Home Medications   Prior to Admission medications   Medication Sig Start Date End Date Taking? Authorizing Provider  aspirin 325 MG tablet Take 1 tablet (325 mg total) by mouth daily. 06/01/15  Yes Rolly SalterPranav M Patel, MD  atorvastatin (LIPITOR) 80 MG tablet Take 1 tablet (80 mg total) by mouth daily at 6 PM. 06/01/15  Yes Rolly SalterPranav M Patel, MD  senna-docusate (SENOKOT-S) 8.6-50 MG tablet Take 1 tablet by mouth at bedtime as needed for mild constipation or moderate constipation. 06/01/15  Yes Rolly SalterPranav M Patel, MD   BP 97/83 mmHg  Pulse 73  Temp(Src) 97.6 F (36.4 C) (Oral)  Resp 16  SpO2 95% Physical Exam  Constitutional: He is oriented to person, place, and time. He appears well-developed and well-nourished.  HENT:  Head: Normocephalic and atraumatic.  Right Ear: External ear normal.  Left Ear: External ear normal.  Eyes: Conjunctivae and EOM are normal. Pupils are equal, round, and reactive to light.  Neck: Normal range of motion and phonation normal. Neck supple.  Cardiovascular:  Normal rate, regular rhythm and normal heart sounds.   Pulmonary/Chest: Effort normal and breath sounds normal. He exhibits no bony tenderness.  Wound left upper chest wall, healing well, and is mildly tender to palpation, this is the site of the recent new recorder placement.  Abdominal: Soft. There is no tenderness.  Musculoskeletal: Normal range of motion.  Neurological: He is alert and oriented to person, place, and time. No cranial nerve deficit  or sensory deficit. He exhibits normal muscle tone. Coordination normal.  No dysarthria, aphasia or nystagmus  Skin: Skin is warm, dry and intact.  Psychiatric: He has a normal mood and affect. His behavior is normal. Judgment and thought content normal.  Nursing note and vitals reviewed.   ED Course  Procedures (including critical care time)   Recent loop recorder placement  Device details:  Medtronic Reveal Linq model number [id number], serial number [id number]   Medications - No data to display  Patient Vitals for the past 24 hrs:  BP Temp Temp src Pulse Resp SpO2  06/12/15 1900 97/83 mmHg - - 73 16 95 %  06/12/15 1830 94/56 mmHg - - 68 15 90 %  06/12/15 1800 (!) 87/50 mmHg - - 75 15 (!) 88 %  06/12/15 1730 (!) 96/49 mmHg - - 83 17 (!) 88 %  06/12/15 1700 (!) 95/54 mmHg - - 70 18 92 %  06/12/15 1630 102/69 mmHg - - 84 20 96 %  06/12/15 1601 101/65 mmHg - - 82 19 95 %  06/12/15 1600 101/65 mmHg - - 81 22 96 %  06/12/15 1515 118/81 mmHg 97.6 F (36.4 C) Oral 101 18 94 %   19:50- after second interrogation, his results are now back from Medtronic. There were no malignant arrhythmias. Around the time that he was symptomatic, yesterday, he had a very transient episode of heart rate around 60, from his usual rate around 100.  7:55 PM Reevaluation with update and discussion. After initial assessment and treatment, an updated evaluation reveals He remains comfortable. Vital signs normal. Findings discussed with patient, all questions answered. [name] L    Labs Review Labs Reviewed  BASIC METABOLIC PANEL - Abnormal; Notable for the following:    Glucose, Bld 100 (*)    All other components within normal limits  CBC - Abnormal; Notable for the following:    Hemoglobin 12.9 (*)    MCH 24.9 (*)    All other components within normal limits  URINALYSIS, ROUTINE W REFLEX MICROSCOPIC (NOT AT [facility]) - Abnormal; Notable for the following:    APPearance CLOUDY (*)    All other  components within normal limits    Imaging Review No results found. I have personally reviewed and evaluated these images and lab results as part of my medical decision-making.   EKG Interpretation   Date/Time:  Saturday June 12 2015 15:22:45 EDT Ventricular Rate:  96 PR Interval:  160 QRS Duration: 104 QT Interval:  340 QTC Calculation: 429 R Axis:   91 Text Interpretation:  Normal sinus rhythm Rightward axis Borderline ECG No  old tracing to compare Confirmed by [facility]  MD, Delvonte Berenson [phone]) on  06/12/2015 3:30:42 PM      MDM   Final diagnoses:  Syncope, unspecified syncope type   Syncope, cause not clear. Patient had transient episode of relative bradycardia from 160, at the time of his symptom yesterday. No episodes of tachyarrhythmias, or ventricular tachycardia. No evidence for serious bacterial infection. Metabolic instability or impending vascular collapse.  Nursing  Notes Reviewed/ Care Coordinated Applicable Imaging Reviewed Interpretation of Laboratory Data incorporated into ED treatment  The patient appears reasonably screened and/or stabilized for discharge and I doubt any other medical condition or other [facility] requiring further screening, evaluation, or treatment in the ED at this time prior to discharge.  Plan: Home Medications- usual; Home Treatments- rest; return here if the recommended treatment, does not improve the symptoms; Recommended follow up- PCP, when necessary. Cardiology and sleep study physician, as scheduled.      Mancel Bale, MD 06/12/15 [phone]

## 2015-06-14 ENCOUNTER — Telehealth: Payer: Self-pay | Admitting: Family Medicine

## 2015-06-14 NOTE — Telephone Encounter (Signed)
Let him know that we are absolutely glad to write him out of work until he has seen cardiologist; please write him a note for Monday April 17th through Monday April 24th, tentatively to return Tuesday April 25th I hope they are able to find out what's going on for him Ask him to call his neurologist about the event he had as well Thank you

## 2015-06-14 NOTE — Telephone Encounter (Signed)
Done letter wrote and patient notified

## 2015-06-14 NOTE — Telephone Encounter (Signed)
PATIENT PASSED OUT ON Friday THE 14TH AND WENT TO THE ER ON THE 15TH AND IS ASKING IF YOU THINK HE NEEDS TO TAKE A LEAVE FROM WORK OR WHAT ARE YOUR SUGGESTIONS FOR HIM TO DO. HE HAS BEEN SEEING CARDIOLOGY AND NEUROLOGIST, BUT HAS APPT WITH CARDIOLOGY ON THE 24TH OF THIS MONTH. THE ER COULD NOT FIND ANY THING WRONG , BUT SAYS THAT HE WONDERS IF HE MAY HAVE A CONCUSSION FOR HE FELL FACE DOWN THE GROUND AND ER DID NOT TEST FOR THAT. THE ONLY SYMPTOMS HE IS HAVING AT THIS TIME IS A HEADACHE SINCE HE FELL IN FRONT OF THE HEAD. WANTS TO KNOW IF COULD BE RESULTS OF HIS STROKE FOR HIS FEAR IS PASSING OUT AT WORK

## 2015-06-21 ENCOUNTER — Encounter: Payer: Self-pay | Admitting: Cardiovascular Disease

## 2015-06-21 ENCOUNTER — Ambulatory Visit (INDEPENDENT_AMBULATORY_CARE_PROVIDER_SITE_OTHER): Payer: BLUE CROSS/BLUE SHIELD | Admitting: Cardiovascular Disease

## 2015-06-21 VITALS — BP 110/80 | HR 100 | Ht 66.0 in | Wt 297.4 lb

## 2015-06-21 DIAGNOSIS — R079 Chest pain, unspecified: Secondary | ICD-10-CM | POA: Diagnosis not present

## 2015-06-21 DIAGNOSIS — Q211 Atrial septal defect: Secondary | ICD-10-CM | POA: Diagnosis not present

## 2015-06-21 DIAGNOSIS — Q2111 Secundum atrial septal defect: Secondary | ICD-10-CM

## 2015-06-21 NOTE — Patient Instructions (Signed)
Medication Instructions:  Your physician recommends that you continue on your current medications as directed. Please refer to the Current Medication list given to you today.  Labwork: No new orders.   Testing/Procedures: Your physician has requested that you have an exercise stress myoview. For further information please visit [email or web address]. Please follow instruction sheet, as given.  Follow-Up: Your physician recommends that you schedule a follow-up appointment as needed with Dr Cooper.    Any Other Special Instructions Will Be Listed Below (If Applicable).     If you need a refill on your cardiac medications before your next appointment, please call your pharmacy.   

## 2015-06-21 NOTE — Progress Notes (Signed)
Cardiology Office Note Date:  06/21/2015   ID:  Austin Fuentes, DOB [birth year], MRN [id number]  PCP:  Baruch Gouty, MD  Cardiologist:  Tonny Bollman, MD   Neurologist: Dr Pearlean Brownie  Chief Complaint  Patient presents with  . New Evaluation    ASD consult    History of Present Illness: Austin Fuentes is a 35 y.o. male who presents for evaluation of an atrial septal defect in the setting of cryptogenic stroke.   The patient was in his normal state of health until he was admitted 05/28/2015 with symptoms of left arm weakness. An MRI of the brain showed an acute ischemic right parietal lobe infarct as well as an additional cortical ischemic nonhemorrhagic infarct in the left parietal lobe. A 2-D echo study was essentially normal. A transcranial Doppler study showed a small right to left shunt and a lower extremity venous Doppler study was negative for DVT. A hypercoagulability panel was negative. 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 There was predominantly left to right flow at rest which shifted to right to left flow with snoring. He is referred for consideration of transcatheter ASD closure.  The patient was evaluated in the emergency department for near syncopal episode since hospital discharge. All findings were reassuring. He otherwise is doing well and tolerating medical therapy with aspirin and atorvastatin. He does admit to some chest pain in the upper chest, sometimes associated with activity but no clear correlation. He has had some symptoms at rest. He denies shortness of breath. He denies edema, orthopnea, or PND. His wife notes that he has not been snoring is much since his stroke. He has been scheduled for an overnight sleep test to evaluate for obstructive sleep apnea.   Past Medical History  Diagnosis Date  . Allergy   . Stroke [facility])     Past Surgical History  Procedure Laterality Date  . Tonsillectomy    . Tee without  cardioversion N/A 05/31/2015    Procedure: TRANSESOPHAGEAL ECHOCARDIOGRAM (TEE);  Surgeon: Thurmon Fair, MD;  Location: [facility] ENDOSCOPY;  Service: Cardiovascular;  Laterality: N/A;  . Ep implantable device N/A 05/31/2015    Procedure: Loop Recorder Insertion;  Surgeon: Thurmon Fair, MD;  Location: [facility] INVASIVE CV LAB;  Service: Cardiovascular;  Laterality: N/A;  . Tonsillectomy      Current Outpatient Prescriptions  Medication Sig Dispense Refill  . aspirin 325 MG tablet Take 1 tablet (325 mg total) by mouth daily. 30 tablet 0  . atorvastatin (LIPITOR) 80 MG tablet Take 1 tablet (80 mg total) by mouth daily at 6 PM. 60 tablet 0   No current facility-administered medications for this visit.    Allergies:   Review of patient's allergies indicates no known allergies.   Social History:  The patient  reports that he has never smoked. He has never used smokeless tobacco. He reports that he does not drink alcohol or use illicit drugs.   Family History:  The patient's  family history includes Stroke in his maternal grandfather.    ROS:  Please see the history of present illness.  Otherwise, review of systems is positive for lightheadedness, chest pain.  All other systems are reviewed and negative.    PHYSICAL EXAM: VS:  BP 110/80 mmHg  Pulse 100  Ht  (1.676 m)  Wt 297 lb 6.4 oz (134.9 kg)  BMI 48.02 kg/m2 , BMI Body mass index is 48.02 kg/(m^2). GEN: Well nourished, well developed, obese male in  no acute distress HEENT: normal Neck: no JVD, no masses. No carotid bruits Cardiac: RRR without murmur or gallop                Respiratory:  clear to auscultation bilaterally, normal work of breathing GI: soft, nontender, nondistended, + BS MS: no deformity or atrophy Ext: no pretibial edema, pedal pulses 2+= bilaterally Skin: warm and dry, no rash Neuro:  Strength and sensation are intact Psych: euthymic mood, full affect  EKG:  EKG is not ordered today.  Recent Labs: 05/27/2015: ALT  26 06/12/2015: BUN 9; Creatinine, Ser 0.96; Hemoglobin 12.9*; Platelets 276; Potassium 3.6; Sodium 140   Lipid Panel     Component Value Date/Time   CHOL 214* 05/28/2015 0551   TRIG 309* 05/28/2015 0551   HDL 29* 05/28/2015 0551   CHOLHDL 7.4 05/28/2015 0551   VLDL 62* 05/28/2015 0551   LDLCALC 123* 05/28/2015 0551      Wt Readings from Last 3 Encounters:  06/21/15 297 lb 6.4 oz (134.9 kg)  06/08/15 294 lb (133.358 kg)  06/08/15 292 lb (132.45 kg)     Cardiac Studies Reviewed: TEE:  Study Conclusions  - Left ventricle: The cavity size was normal. Wall thickness was  normal. Systolic function was normal. The estimated ejection  fraction was in the range of 55% to 60%. Wall motion was normal;  there were no regional wall motion abnormalities. - Left atrium: No evidence of thrombus in the atrial cavity or  appendage. - Right atrium: No evidence of thrombus in the atrial cavity or  appendage. - Atrial septum: There was a medium-sized, 0.4 cm x 0.3 cm high  secundum atrial septal defect. Doppler and agitated saline  contrast study showed a small bidirectional, but predominantly  left-to-right, atrial level shunt, in the baseline state.  Immediate and marked reversal of shunt is seen with snoring.  There was an atrial septal aneurysm.  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 The patient is here with his wife today. I have reviewed the association between PFO/ASD and cryptogenic stroke with them. They understand these are commonly encountered anomalies in the general population, but they are seen more frequently and cryptogenic stroke patients. His ROPE Score = 9 based on lack of HTN, diabetes, smoking, or previous stroke, as well as cortical infarcts seen on MR imaging and age 46 years old. This implies a high likelihood of a PFO associated stroke. However, the patient also has obesity,  hyperlipidemia, and possibly obstructive sleep apnea. I have reviewed the clinical trial data with the patient and his wife regarding transcatheter PFO closure versus medical therapy. I have discussed his case with Dr. Pearlean Brownie who is the patient's stroke neurologist. Based on this discussion, favor ongoing medical treatment with ASA, a statin drug, focus on lifestyle modification and weight loss, and assessment/treatment of possible sleep apnea. Certainly if he has a recurrent event, transcatheter closure could be performed. On review of this patient's TEE, his anatomy is suitable for closure. However, there is no clear cardiac indication for ASD closure since the defect is relatively small. I will be happy to see him back in the future if problems arise.   Chest pain: typical and atypical features. Risk factors of hyperlipidemia and obesity, as well as stroke, as outlined. Plan exercise Myoview stress test for risk stratification.   Current medicines are reviewed with the patient today.  The patient does not have concerns regarding medicines.  Labs/  tests ordered today include:  No orders of the defined types were placed in this encounter.    Disposition:   FU as needed  Signed, Tonny Bollmanooper, Jonerik Sliker, MD  06/21/2015 11:26 AM    [facility] [address], [place], [place]  [address] Phone: [phone]; Fax: [phone]

## 2015-06-22 ENCOUNTER — Ambulatory Visit (INDEPENDENT_AMBULATORY_CARE_PROVIDER_SITE_OTHER): Payer: BLUE CROSS/BLUE SHIELD | Admitting: Family Medicine

## 2015-06-22 VITALS — BP 112/82 | HR 85 | Temp 97.7°F | Resp 16 | Wt 298.0 lb

## 2015-06-22 DIAGNOSIS — I639 Cerebral infarction, unspecified: Secondary | ICD-10-CM

## 2015-06-22 DIAGNOSIS — R55 Syncope and collapse: Secondary | ICD-10-CM

## 2015-06-22 NOTE — Progress Notes (Signed)
BP 112/82 mmHg  Pulse 85  Temp(Src) 97.7 F (36.5 C) (Oral)  Resp 16  Wt 298 lb (135.172 kg)  SpO2 95%   Subjective:    Patient ID: Austin Fuentes, male    DOB: 04-15-1980, 35 y.o.   MRN: [id number]  HPI: Austin Fuentes is a 35 y.o. male  Chief Complaint  Patient presents with  . FLMA    paperwork   Patient is here to get paperwork done for FMLA Since last visit, he passed out and went to the ER medtronics loop recorder was interrogated Was laughing and then had a headache for a week afterwards Going to have stress test next week, two day testing per cardiologist No plan to close the hole in the heart unless he has another stroke Dr. Excell Seltzer and neurologist talked together They are doing sleep apnea test and cholesterol pills If he has another stroke, then they will close the hole Taking 325 mg daily aspirin and lipitor Not successful with weight loss; has a treadmill but blocked by mattress, has cut back on sodas  Relevant past medical, surgical, family and social history reviewed Past Medical History  Diagnosis Date  . Allergy   . Stroke [facility])   MD note: patent foramen ovale, hyperlipidemia, morbid obesity, impaired fasting glucose  Past Surgical History  Procedure Laterality Date  . Tonsillectomy    . Tee without cardioversion N/A 05/31/2015    Procedure: TRANSESOPHAGEAL ECHOCARDIOGRAM (TEE);  Surgeon: Thurmon Fair, MD;  Location: [facility] ENDOSCOPY;  Service: Cardiovascular;  Laterality: N/A;  . Ep implantable device N/A 05/31/2015    Procedure: Loop Recorder Insertion;  Surgeon: Thurmon Fair, MD;  Location: [facility] INVASIVE CV LAB;  Service: Cardiovascular;  Laterality: N/A;  . Tonsillectomy     Social History  Substance Use Topics  . Smoking status: Never Smoker   . Smokeless tobacco: Never Used  . Alcohol Use: No   Interim medical history since our last visit reviewed. Allergies and medications reviewed  Review of Systems Per HPI unless specifically indicated  above     Objective:    BP 112/82 mmHg  Pulse 85  Temp(Src) 97.7 F (36.5 C) (Oral)  Resp 16  Wt 298 lb (135.172 kg)  SpO2 95%  Wt Readings from Last 3 Encounters:  07/09/15 297 lb 3.2 oz (134.809 kg)  07/01/15 297 lb (134.718 kg)  06/22/15 298 lb (135.172 kg)    Physical Exam  Constitutional: He appears well-developed and well-nourished. No distress.  Eyes: EOM are normal. No scleral icterus.  Neck: No JVD present.  Cardiovascular: Normal rate and regular rhythm.   Pulmonary/Chest: Effort normal and breath sounds normal. He exhibits tenderness (over site of loop recorder; no fluctuance).  Neurological: He is alert. He displays no tremor. Gait normal.  Psychiatric: He has a normal mood and affect.    Results for orders placed or performed during the hospital encounter of 06/12/15  Basic metabolic panel  Result Value Ref Range   Sodium 140 135 - 145 mmol/L   Potassium 3.6 3.5 - 5.1 mmol/L   Chloride 107 101 - 111 mmol/L   CO2 27 22 - 32 mmol/L   Glucose, Bld 100 (H) 65 - 99 mg/dL   BUN 9 6 - 20 mg/dL   Creatinine, Ser 6.96 0.61 - 1.24 mg/dL   Calcium 9.1 8.9 - 29.5 mg/dL   GFR calc non Af Amer >60 >60 mL/min   GFR calc Af Amer >60 >60 mL/min   Anion gap  6 5 - 15  CBC  Result Value Ref Range   WBC 8.5 4.0 - 10.5 K/uL   RBC 5.18 4.22 - 5.81 MIL/uL   Hemoglobin 12.9 (L) 13.0 - 17.0 g/dL   HCT 16.140.9 09.639.0 - 04.552.0 %   MCV 79.0 78.0 - 100.0 fL   MCH 24.9 (L) 26.0 - 34.0 pg   MCHC 31.5 30.0 - 36.0 g/dL   RDW 40.913.2 81.111.5 - 91.415.5 %   Platelets 276 150 - 400 K/uL  Urinalysis, Routine w reflex microscopic (not at [facility])  Result Value Ref Range   Color, Urine YELLOW YELLOW   APPearance CLOUDY (A) CLEAR   Specific Gravity, Urine 1.025 1.005 - 1.030   pH 7.5 5.0 - 8.0   Glucose, UA NEGATIVE NEGATIVE mg/dL   Hgb urine dipstick NEGATIVE NEGATIVE   Bilirubin Urine NEGATIVE NEGATIVE   Ketones, ur NEGATIVE NEGATIVE mg/dL   Protein, ur NEGATIVE NEGATIVE mg/dL   Nitrite NEGATIVE  NEGATIVE   Leukocytes, UA NEGATIVE NEGATIVE      Assessment & Plan:   Problem List Items Addressed This Visit      Cardiovascular and Mediastinum   Cryptogenic stroke [facility](HCC) - Primary    Working with neurologist and cardiologist; FMLA paperwork completed; see AVS      Syncope    Seen in ER, implantable loop recorder interrogated; do not drive; cautions given         Follow up plan: No Follow-up on file.  An after-visit summary was printed and given to the patient at check-out.  Please see the patient instructions which may contain other information and recommendations beyond what is mentioned above in the assessment and plan.

## 2015-06-22 NOTE — Patient Instructions (Signed)
Keep the appointment for next month I will strongly encourage you to NOT drive if any chance you might pass out

## 2015-06-24 ENCOUNTER — Encounter (HOSPITAL_COMMUNITY): Payer: Self-pay | Admitting: *Deleted

## 2015-06-28 ENCOUNTER — Telehealth (HOSPITAL_COMMUNITY): Payer: Self-pay | Admitting: *Deleted

## 2015-06-28 ENCOUNTER — Ambulatory Visit: Payer: BLUE CROSS/BLUE SHIELD | Admitting: Family Medicine

## 2015-06-28 NOTE — Telephone Encounter (Signed)
Left message on voicemail per DPR in reference to upcoming appointment scheduled on 07/01/15 with detailed instructions given per Myocardial Perfusion Study Information Sheet for the test. LM to arrive 15 minutes early, and that it is imperative to arrive on time for appointment to keep from having the test rescheduled. If you need to cancel or reschedule your appointment, please call the office within 24 hours of your appointment. Failure to do so may result in a cancellation of your appointment, and a $50 no show fee. Phone number given for call back for any questions. Laquinda Moller J Yexalen Deike, RN   

## 2015-06-30 ENCOUNTER — Ambulatory Visit (INDEPENDENT_AMBULATORY_CARE_PROVIDER_SITE_OTHER): Payer: BLUE CROSS/BLUE SHIELD | Admitting: Neurology

## 2015-06-30 ENCOUNTER — Ambulatory Visit (INDEPENDENT_AMBULATORY_CARE_PROVIDER_SITE_OTHER): Payer: BLUE CROSS/BLUE SHIELD | Admitting: *Deleted

## 2015-06-30 DIAGNOSIS — G4733 Obstructive sleep apnea (adult) (pediatric): Secondary | ICD-10-CM

## 2015-06-30 DIAGNOSIS — I6349 Cerebral infarction due to embolism of other cerebral artery: Secondary | ICD-10-CM

## 2015-06-30 DIAGNOSIS — I63031 Cerebral infarction due to thrombosis of right carotid artery: Secondary | ICD-10-CM

## 2015-06-30 DIAGNOSIS — G4726 Circadian rhythm sleep disorder, shift work type: Secondary | ICD-10-CM

## 2015-06-30 DIAGNOSIS — G4730 Sleep apnea, unspecified: Secondary | ICD-10-CM

## 2015-06-30 DIAGNOSIS — R0683 Snoring: Secondary | ICD-10-CM

## 2015-07-01 ENCOUNTER — Ambulatory Visit (HOSPITAL_COMMUNITY): Payer: BLUE CROSS/BLUE SHIELD | Attending: Cardiology

## 2015-07-01 DIAGNOSIS — R9439 Abnormal result of other cardiovascular function study: Secondary | ICD-10-CM | POA: Insufficient documentation

## 2015-07-01 DIAGNOSIS — R079 Chest pain, unspecified: Secondary | ICD-10-CM | POA: Diagnosis not present

## 2015-07-01 MED ORDER — TECHNETIUM TC 99M SESTAMIBI GENERIC - CARDIOLITE
32.4000 | Freq: Once | INTRAVENOUS | Status: AC | PRN
  Administered 2015-07-01: 32 via INTRAVENOUS

## 2015-07-01 NOTE — Progress Notes (Signed)
Carelink Summary Report / Loop Recorder 

## 2015-07-02 ENCOUNTER — Ambulatory Visit (HOSPITAL_COMMUNITY): Payer: BLUE CROSS/BLUE SHIELD | Attending: Cardiology

## 2015-07-02 LAB — MYOCARDIAL PERFUSION IMAGING
CHL CUP NUCLEAR SSS: 2
CHL CUP RESTING HR STRESS: 88 {beats}/min
CSEPED: 7 min
CSEPEDS: 15 s
Estimated workload: 8.9 METS
LVDIAVOL: 148 mL (ref 62–150)
LVSYSVOL: 67 mL
MPHR: 186 {beats}/min
NUC STRESS TID: 0.94
Peak HR: 173 {beats}/min
Percent HR: 93 %
RATE: 0.38
SDS: 0
SRS: 2

## 2015-07-02 MED ORDER — TECHNETIUM TC 99M SESTAMIBI GENERIC - CARDIOLITE
32.3000 | Freq: Once | INTRAVENOUS | Status: AC | PRN
  Administered 2015-07-02: 32.3 via INTRAVENOUS

## 2015-07-02 NOTE — Assessment & Plan Note (Signed)
Encouraged weight loss; see AVS 

## 2015-07-08 ENCOUNTER — Telehealth: Payer: Self-pay | Admitting: Cardiovascular Disease

## 2015-07-08 NOTE — Telephone Encounter (Signed)
Follow message       The pt is returning a phone call from a couple days ago about the stress test results, from Lauren.

## 2015-07-08 NOTE — Telephone Encounter (Signed)
I spoke with the pt and made him aware of myoview results.

## 2015-07-09 ENCOUNTER — Encounter: Payer: Self-pay | Admitting: Neurology

## 2015-07-09 ENCOUNTER — Ambulatory Visit (INDEPENDENT_AMBULATORY_CARE_PROVIDER_SITE_OTHER): Payer: BLUE CROSS/BLUE SHIELD | Admitting: Neurology

## 2015-07-09 VITALS — BP 124/89 | HR 80 | Ht 66.0 in | Wt 297.2 lb

## 2015-07-09 DIAGNOSIS — I639 Cerebral infarction, unspecified: Secondary | ICD-10-CM | POA: Diagnosis not present

## 2015-07-09 DIAGNOSIS — R55 Syncope and collapse: Secondary | ICD-10-CM | POA: Insufficient documentation

## 2015-07-09 HISTORY — DX: Cerebral infarction, unspecified: I63.9

## 2015-07-09 HISTORY — DX: Syncope and collapse: R55

## 2015-07-09 MED ORDER — COQ10 200 MG PO CAPS: 1.0000 | ORAL_CAPSULE | Freq: Every day | ORAL | Status: DC

## 2015-07-09 NOTE — Patient Instructions (Signed)
I had a long d/w patient and wife about his recent cryptogenic stroke, risk for recurrent stroke/TIAs, personally independently reviewed imaging studies and stroke evaluation results and answered questions.Continue aspirin 325 mg daily  for secondary stroke prevention and maintain strict control of hypertension with blood pressure goal below 130/90, diabetes with hemoglobin A1c goal below 6.5% and lipids with LDL cholesterol goal below 70 mg/dL. I also advised the patient to eat a healthy diet with plenty of whole grains, cereals, fruits and vegetables, exercise regularly and maintain ideal body weight .I do not think his small atrial septal aneurysm and ASD responsible for his stroke. I'm concerned about the recent episode of syncope and collapse that he had and would like to evaluate this further by checking an EEG for seizure activity and MRI for interval new strokes. I have also advised him to start taking coenzyme every 10 200 mg daily to help with his muscle aches which may be statin related. If this continue may consider switching Lipitor to Crestor in the future. Patient may also possibly consider participation in the RESPECT ESUS trial if interested. I gave him written information to review at home and decide. I advised him not to drive for the next 3-6 months as per [facility]. He was advised to continue follow-up with Dr. Vickey Huger for her polysomnogram results Followup in the future with me in 6 months or call earlier if necessary. Stroke Prevention Some medical conditions and behaviors are associated with an increased chance of having a stroke. You may prevent a stroke by making healthy choices and managing medical conditions. HOW CAN I REDUCE MY RISK OF HAVING A STROKE?   Stay physically active. Get at least 30 minutes of activity on most or all days.  Do not smoke. It may also be helpful to avoid exposure to secondhand smoke.  Limit alcohol use. Moderate alcohol use is considered to  be:  No more than 2 drinks per day for men.  No more than 1 drink per day for nonpregnant women.  Eat healthy foods. This involves:  Eating 5 or more servings of fruits and vegetables a day.  Making dietary changes that address high blood pressure (hypertension), high cholesterol, diabetes, or obesity.  Manage your cholesterol levels.  Making food choices that are high in fiber and low in saturated fat, trans fat, and cholesterol may control cholesterol levels.  Take any prescribed medicines to control cholesterol as directed by your health care provider.  Manage your diabetes.  Controlling your carbohydrate and sugar intake is recommended to manage diabetes.  Take any prescribed medicines to control diabetes as directed by your health care provider.  Control your hypertension.  Making food choices that are low in salt (sodium), saturated fat, trans fat, and cholesterol is recommended to manage hypertension.  Ask your health care provider if you need treatment to lower your blood pressure. Take any prescribed medicines to control hypertension as directed by your health care provider.  If you are 80-84 years of age, have your blood pressure checked every 3-5 years. If you are 54 years of age or older, have your blood pressure checked every year.  Maintain a healthy weight.  Reducing calorie intake and making food choices that are low in sodium, saturated fat, trans fat, and cholesterol are recommended to manage weight.  Stop drug abuse.  Avoid taking birth control pills.  Talk to your health care provider about the risks of taking birth control pills if you are over 35  years old, smoke, get migraines, or have ever had a blood clot.  Get evaluated for sleep disorders (sleep apnea).  Talk to your health care provider about getting a sleep evaluation if you snore a lot or have excessive sleepiness.  Take medicines only as directed by your health care provider.  For some  people, aspirin or blood thinners (anticoagulants) are helpful in reducing the risk of forming abnormal blood clots that can lead to stroke. If you have the irregular heart rhythm of atrial fibrillation, you should be on a blood thinner unless there is a good reason you cannot take them.  Understand all your medicine instructions.  Make sure that other conditions (such as anemia or atherosclerosis) are addressed. SEEK IMMEDIATE MEDICAL CARE IF:   You have sudden weakness or numbness of the face, arm, or leg, especially on one side of the body.  Your face or eyelid droops to one side.  You have sudden confusion.  You have trouble speaking (aphasia) or understanding.  You have sudden trouble seeing in one or both eyes.  You have sudden trouble walking.  You have dizziness.  You have a loss of balance or coordination.  You have a sudden, severe headache with no known cause.  You have new chest pain or an irregular heartbeat. Any of these symptoms may represent a serious problem that is an emergency. Do not wait to see if the symptoms will go away. Get medical help at once. Call your local emergency services (911 in [place] Do not drive yourself to the hospital.   This information is not intended to replace advice given to you by your health care provider. Make sure you discuss any questions you have with your health care provider.   Document Released: 03/23/2004 Document Revised: 03/06/2014 Document Reviewed: 08/16/2012 Elsevier Interactive Patient Education [employer]2016 [employer]

## 2015-07-09 NOTE — Progress Notes (Signed)
[facility] [address] [place]. [place] [address] [phone]       OFFICE FOLLOW-UP NOTE  Mr. Austin Fuentes Date of Birth:  1980/11/06 Medical Record Number:  [id number]   HPI: 35 year old Caucasian male with recent admission for stroke to [facility] in March 2017 was seen today for first office follow-up visit for stroke. He is accompanied by his wife. Austin Fuentes is a 35 y.o. male with No past medical history who presented on 05/27/15 with abnormal sensation of depth perception being off on his left side that started this morning. He stated that he was last normal prior to bed 05/26/15  night and subsequently on awakening this morning he reached for his phone and felt like his "left arm felt like jelly." throughout the day he has been reaching for things and they have not been where he expected them to be. Finally, his wife convinced him to come in and be checked out and a head CT revealed a hypodensity in the right parietal region consistent with infarct. He denies any recent illnesses other than slight cough for the past couple of weeks. He denies headaches, denies neck pain. LKW: 3/29 prior to bed tpa given?: no, Out of window Premorbid modified rankin scale: 0 CT scan of the head on admission showed a focal right parietal hypodensity possible subacute infarct. CT angiogram of the brain and neck were both unremarkable without any large vessel stenosis. MRI scan of the brain confirmed acute nonhemorrhagic right parietal lobe MCA branch infarct without significant mass effect. There was a small additional subcentimeter cortical nonhemorrhagic left parietal lobe infarct as well. There was possible small remote age infarct involving the superior right cerebellar vermis. Patient underwent extensive evaluation for stroke etiology all of which was mostly negative. Transthoracic echo showed normal ejection fraction. Transesophageal echo showed a moderate size  atrial septum secundum defect with a small mostly left to right shunt. Transcranial Doppler bubble study showed only a small intracardiac shunt. Hemoglobin A1c is 5.7. LDL cholesterol is limited at 123. Lab work for vasculitis on normal. Complete hypercoagulable panel labs were spent in all of them was negative. Lab work for celiac disease was also negative. Patient was started on aspirin and stroke etiology is felt to be cryptogenic. Patient is also on Lipitor 80 mg. He is tolerating both medications well. Does complain of joint pains and muscle aches which may be Lipitor related. He is not having any bleeding or bruising. Patient had a body mass index of 47 and was felt to be at high risk for sleep apnea. He has seen Dr. Kandyce Rud as an outpatient and completed a polysomnogram study last 1 yesterday but the results are yet pending. Patient states he is a code completely sense of depth perception and left-sided visual spatial attention is a covered completely. He has no residual symptoms or deficits from his stroke. He however has a new episode 2 weeks ago when he passed out briefly. He was visiting at a friend's home and had been out. He felt lightheaded dizzy and tried to lean on his wife who could not bend his weight and moved and realized that he fell face forward on the ground. He was unresponsive for a few seconds but then woke up quickly and was aware of his surroundings. He had a minor scrape on his nose but not have any tongue bite or incontinence. The wife did not notice any tonic-clonic activity. Patient had a loop recorder inserted previously  during the hospitalization for stroke and this was interrogated by cardiology and he was not found to have an ongoing cardiac arrhythmia at the time of this episode. Patient did complain of a headache for about a week after this but that has gradually resolved. He has also seen Dr. Tonny Bollman interventional cardiologist in the interim for his ASD who felt the  defect was not significant enough at the present time to merit closure and stroke etiology is likely cryptogenic and to try medical therapy first ROS:   14 system review of systems is positive for  passing out, joint pain, muscle aches, frequent waking, snoring and all other systems negative  PMH:  Past Medical History  Diagnosis Date  . Allergy   . Stroke [facility])     Social History:  Social History   Social History  . Marital Status: Married    Spouse Name: N/A  . Number of Children: N/A  . Years of Education: N/A   Occupational History  . Not on file.   Social History Main Topics  . Smoking status: Never Smoker   . Smokeless tobacco: Never Used  . Alcohol Use: No  . Drug Use: No  . Sexual Activity: No   Other Topics Concern  . Not on file   Social History Narrative    Medications:   Current Outpatient Prescriptions on File Prior to Visit  Medication Sig Dispense Refill  . aspirin 325 MG tablet Take 1 tablet (325 mg total) by mouth daily. 30 tablet 0  . atorvastatin (LIPITOR) 80 MG tablet Take 1 tablet (80 mg total) by mouth daily at 6 PM. 60 tablet 0   No current facility-administered medications on file prior to visit.    Allergies:  No Known Allergies  Physical Exam General: obese young Caucasian male, seated, in no evident distress Head: head normocephalic and atraumatic.  Neck: supple with no carotid or supraclavicular bruits Cardiovascular: regular rate and rhythm, no murmurs Musculoskeletal: no deformity Skin:  no rash/petichiae Vascular:  Normal pulses all extremities Filed Vitals:   07/09/15 1019  BP: 124/89  Pulse: 80   Neurologic Exam Mental Status: Awake and fully alert. Oriented to place and time. Recent and remote memory intact. Attention span, concentration and fund of knowledge appropriate. Mood and affect appropriate.  Cranial Nerves: Fundoscopic exam reveals sharp disc margins. Pupils equal, briskly reactive to light. Extraocular  movements full without nystagmus. Visual fields full to confrontation. Hearing intact. Facial sensation intact. Face, tongue, palate moves normally and symmetrically.  Motor: Normal bulk and tone. Normal strength in all tested extremity muscles. Sensory.: intact to touch ,pinprick .position and vibratory sensation.  Coordination: Rapid alternating movements normal in all extremities. Finger-to-nose and heel-to-shin performed accurately bilaterally. Gait and Station: Arises from chair without difficulty. Stance is normal. Gait demonstrates normal stride length and balance . Able to heel, toe and tandem walk without difficulty.  Reflexes: 1+ and symmetric. Toes downgoing.   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 Recent episode of brief loss of consciousness 2 weeks ago possibly syncope versus seizure needs further evaluation    PLAN: I had a long d/w patient and wife about his recent cryptogenic stroke, risk for recurrent stroke/TIAs, personally independently reviewed imaging studies and stroke evaluation  results and answered questions.Continue aspirin 325 mg daily  for secondary stroke prevention and maintain strict control of hypertension with blood pressure goal below 130/90, diabetes with hemoglobin A1c goal below 6.5% and lipids with LDL cholesterol goal below 70 mg/dL. I also advised the patient to eat a healthy diet with plenty of whole grains, cereals, fruits and vegetables, exercise regularly and maintain ideal body weight .I do not think his small atrial septal aneurysm and ASD responsible for his stroke. I'm concerned about the recent episode of syncope and collapse that he had and would like to evaluate this further by checking an  EEG for seizure activity and MRI for interval new strokes. I have also advised him to start taking coenzyme every 10 200 mg daily to help with his muscle aches which may be statin related. If this continue may consider switching Lipitor to Crestor in the future. Patient may also possibly consider participation in the RESPECT ESUS trial if interested. I gave him written information to review at home and decide. I advised him not to drive for the next 3-6 months as per [facility]  law. He was advised to continue follow-up with Dr. Vickey Hugerohmeier for her polysomnogram results Followup in the future with me in 6 months or call earlier if necessary. Greater than 50% of time during this 25 minute visit was spent on counseling,explanation of diagnosis, planning of further management, discussion with patient and family and coordination of care Delia HeadyPramod Altus Zaino, MD  [facility] [address] [place], [place] [id number]  Phone [phone] Fax [phone] Note: This document was prepared with digital dictation and possible smart phrase technology. Any transcriptional errors that result from this process are unintentional

## 2015-07-11 ENCOUNTER — Encounter: Payer: Self-pay | Admitting: Family Medicine

## 2015-07-11 NOTE — Assessment & Plan Note (Signed)
Working with neurologist and cardiologist; FMLA paperwork completed; see AVS

## 2015-07-11 NOTE — Assessment & Plan Note (Signed)
Seen in ER, implantable loop recorder interrogated; do not drive; cautions given

## 2015-07-14 ENCOUNTER — Telehealth: Payer: Self-pay

## 2015-07-14 DIAGNOSIS — G4733 Obstructive sleep apnea (adult) (pediatric): Secondary | ICD-10-CM

## 2015-07-14 NOTE — Telephone Encounter (Signed)
Patient is returning a call. °

## 2015-07-14 NOTE — Telephone Encounter (Signed)
Spoke to pt regarding his sleep study results. I advised pt that his sleep study revealed severe osa and Dr. Vickey Hugerohmeier recommends starting a cpap. Pt is agreeable to starting a cpap, but he is requesting a FFM instead of the nasal pillows. I advised pt that a DME will call him to set up his cpap. Will send to [facility]. Pt verbalized understanding. I advised pt to avoid sedative-hypnotics which may worsen sleep apnea, alcohol and tobacco. Pt verbalized understanding. I advised pt to lose weight, diet, and exercise if not contraindicated by his other physicians. Pt verbalized understanding. Pt was advised to not drive or operate hazardous machinery when sleepy. Pt verbalized understanding. A follow up appt was made for 09/20/2015.

## 2015-07-14 NOTE — Telephone Encounter (Signed)
Called pt to discuss sleep study results. No answer, left a message asking him to call me back. 

## 2015-07-20 ENCOUNTER — Ambulatory Visit (INDEPENDENT_AMBULATORY_CARE_PROVIDER_SITE_OTHER): Payer: BLUE CROSS/BLUE SHIELD | Admitting: Family Medicine

## 2015-07-20 ENCOUNTER — Telehealth: Payer: Self-pay | Admitting: Neurology

## 2015-07-20 ENCOUNTER — Encounter: Payer: Self-pay | Admitting: Family Medicine

## 2015-07-20 VITALS — BP 118/78 | HR 82 | Temp 98.2°F | Resp 20 | Ht 66.0 in | Wt 292.4 lb

## 2015-07-20 DIAGNOSIS — Z5181 Encounter for therapeutic drug level monitoring: Secondary | ICD-10-CM | POA: Diagnosis not present

## 2015-07-20 DIAGNOSIS — G4733 Obstructive sleep apnea (adult) (pediatric): Secondary | ICD-10-CM | POA: Diagnosis not present

## 2015-07-20 DIAGNOSIS — R7303 Prediabetes: Secondary | ICD-10-CM

## 2015-07-20 DIAGNOSIS — L409 Psoriasis, unspecified: Secondary | ICD-10-CM

## 2015-07-20 DIAGNOSIS — E785 Hyperlipidemia, unspecified: Secondary | ICD-10-CM | POA: Diagnosis not present

## 2015-07-20 DIAGNOSIS — I6349 Cerebral infarction due to embolism of other cerebral artery: Secondary | ICD-10-CM | POA: Diagnosis not present

## 2015-07-20 HISTORY — DX: Psoriasis, unspecified: L40.9

## 2015-07-20 HISTORY — DX: Obstructive sleep apnea (adult) (pediatric): G47.33

## 2015-07-20 MED ORDER — FLUOCINONIDE 0.05 % EX OINT: 1.0000 "application " | TOPICAL_OINTMENT | Freq: Two times a day (BID) | CUTANEOUS | Status: DC

## 2015-07-20 NOTE — Telephone Encounter (Signed)
Pt called said he saw PCP and is trying to get a release to sign off to go back to work. PCP would not do it because Dr Pearlean BrownieSethi is still having test done. He is wanting to know if his leave of work will need to be extended, it ends on Monday 07/26/15. Please call

## 2015-07-20 NOTE — Assessment & Plan Note (Signed)
Next A1c due Oct 1st or just after; keep up weight loss efforts and healthy eating

## 2015-07-20 NOTE — Assessment & Plan Note (Signed)
Steroid cream Rxd; too strong for face, groin, underarms I explained to him

## 2015-07-20 NOTE — Assessment & Plan Note (Signed)
Check fasting lipids today; on 80 mg of atorvastatin; continue CoQ10; limit egg yolks to 3 a week

## 2015-07-20 NOTE — Assessment & Plan Note (Signed)
Check SGPT on the statin 

## 2015-07-20 NOTE — Telephone Encounter (Signed)
I agree we should and extend his leave till his tests are completed

## 2015-07-20 NOTE — Patient Instructions (Addendum)
Check A1c on or after October 1st, along with other fasting labs (move up Oct 17th visit) Keep up the great job with lifestyle changes Check out the information at [email or web address][email or web address] entitled "Nutrition for Weight Loss: What You Need to Know about Fad Diets" Try to lose between 1-2 pounds per week by taking in fewer calories and burning off more calories You can succeed by limiting portions, limiting foods dense in calories and fat, becoming more active, and drinking 8 glasses of water a day (64 ounces) Don't skip meals, especially breakfast, as skipping meals may alter your metabolism Do not use over-the-counter weight loss pills or gimmicks that claim rapid weight loss A healthy BMI (or body mass index) is between 18.5 and 24.9 You can calculate your ideal BMI at the NIH website [employer]huhttp://www.nhlbi.nih.gov/health/educational/lose_wt/BMI/bmicalc.htm

## 2015-07-20 NOTE — Telephone Encounter (Signed)
Sent to wrong md.

## 2015-07-20 NOTE — Telephone Encounter (Signed)
Rn call patient back about his leave of absence. Pt states his PCP did leave of absence till May 29. Pt stated he has MRI schedule June 2 and EEG schedule June 8. Pt stated his PCP wants to know if the leave needs to be extended. Rn stated a message will be sent to Dr. Pearlean BrownieSethi. Pt verbalized understanding.

## 2015-07-20 NOTE — Progress Notes (Signed)
BP 118/78 mmHg  Pulse 82  Temp(Src) 98.2 F (36.8 C)  Resp 20  Ht  (1.676 m)  Wt 292 lb 7 oz (132.649 kg)  BMI 47.22 kg/m2  SpO2 96%   Subjective:    Patient ID: CHIEF WALKUP, male    DOB: September 03, 1980, 35 y.o.   MRN: [id number]  HPI: Austin Fuentes is a 35 y.o. male  Chief Complaint  Patient presents with  . cryptogenic stroke follow up    6 week follow up and labs   He and his wife have really changed up their diet; avocados and eggs in the AM, more salads, more fruit; kale and cannelini beans Having some aches on the statin; added CoQ10 Taking the 80 mg atorvastatin at bedtime Has lost weight since last visit Slack with exercise Wrestled in high school getting enough water Just saw neurologist; going to have another MRI, EEG Did the sleep study, going to wear CPAP He also has psoriasis, used to use fluocinonide; would like refill; has active spot on right knee right now  Depression screen PHQ 2/9 07/20/2015  Decreased Interest 0  Down, Depressed, Hopeless 0  PHQ - 2 Score 0   Relevant past medical, surgical, family and social history reviewed Past Medical History  Diagnosis Date  . Allergy   . Stroke (HCC)   . OSA (obstructive sleep apnea) 07/20/2015    To wear CPAP  . Psoriasis 07/20/2015  MD notes: obesity, sleep apnea, high cholesterol, impaired fasting glucose  Past Surgical History  Procedure Laterality Date  . Tonsillectomy    . Tee without cardioversion N/A 05/31/2015    Procedure: TRANSESOPHAGEAL ECHOCARDIOGRAM (TEE);  Surgeon: Thurmon Fair, MD;  Location: [facility] ENDOSCOPY;  Service: Cardiovascular;  Laterality: N/A;  . Ep implantable device N/A 05/31/2015    Procedure: Loop Recorder Insertion;  Surgeon: Thurmon Fair, MD;  Location: [facility] INVASIVE CV LAB;  Service: Cardiovascular;  Laterality: N/A;  . Tonsillectomy     Family History  Problem Relation Age of Onset  . Stroke Maternal Grandfather    Social History  Substance Use Topics  . Smoking  status: Never Smoker   . Smokeless tobacco: Never Used  . Alcohol Use: No   Interim medical history since last visit reviewed. Allergies and medications reviewed  Review of Systems Per HPI unless specifically indicated above     Objective:    BP 118/78 mmHg  Pulse 82  Temp(Src) 98.2 F (36.8 C)  Resp 20  Ht  (1.676 m)  Wt 292 lb 7 oz (132.649 kg)  BMI 47.22 kg/m2  SpO2 96%  Wt Readings from Last 3 Encounters:  07/20/15 292 lb 7 oz (132.649 kg)  07/09/15 297 lb 3.2 oz (134.809 kg)  07/01/15 297 lb (134.718 kg)    Physical Exam  Constitutional: He appears well-developed and well-nourished. No distress.  Weight down 5+ pounds  HENT:  Head: Normocephalic and atraumatic.  Eyes: EOM are normal. No scleral icterus.  Neck: No thyromegaly present.  Cardiovascular: Normal rate and regular rhythm.   Pulmonary/Chest: Effort normal and breath sounds normal.  Abdominal: Soft. He exhibits no distension.  Musculoskeletal: He exhibits no edema.  Neurological: Coordination normal.  Skin: Skin is warm and dry. No pallor.  Erythematous plaque on the right knee extensor surface with thick white scale; no ridges or pits in teh fingernaiils  Psychiatric: He has a normal mood and affect. His behavior is normal. Judgment and thought content normal.    Results  for orders placed or performed in visit on 07/01/15  Myocardial Perfusion Imaging  Result Value Ref Range   Rest HR 88 bpm   Rest BP 102/72 mmHg   Exercise duration (min) 7 min   Exercise duration (sec) 15 sec   Estimated workload 8.9 METS   Peak HR 173 bpm   Peak BP 122/78 mmHg   MPHR 186 bpm   Percent HR 93 %   RPE     LV sys vol 67 mL   TID 0.94    LV dias vol 148 62 - 150 mL   LHR 0.38    SSS 2    SRS 2    SDS 0       Assessment & Plan:   Problem List Items Addressed This Visit      Cardiovascular and Mediastinum   CVA (cerebral vascular accident) [facility](HCC)    Seeing neurologist; since he still needs MRI and  EEG, I will defer return to work to neurologist        Respiratory   OSA (obstructive sleep apnea)    Completed sleep study, going to have CPAP        Musculoskeletal and Integument   Psoriasis    Steroid cream Rxd; too strong for face, groin, underarms I explained to him        Other   Hyperlipidemia - Primary    Check fasting lipids today; on 80 mg of atorvastatin; continue CoQ10; limit egg yolks to 3 a week      Relevant Orders   Lipid Panel w/o Chol/HDL Ratio   Medication monitoring encounter    Check SGPT on the statin      Relevant Orders   ALT   Prediabetes    Next A1c due Oct 1st or just after; keep up weight loss efforts and healthy eating      Severe obesity (BMI >= 40) (HCC)    Praise given, encouragement to keep up those healthy lifestyle          Follow up plan: Return in about 4 months (around 11/29/2015) for visit and fasting labs.  An after-visit summary was printed and given to the patient at check-out.  Please see the patient instructions which may contain other information and recommendations beyond what is mentioned above in the assessment and plan.  Meds ordered this encounter  Medications  . fluocinonide ointment (LIDEX) 0.05 %    Sig: Apply 1 application topically 2 (two) times daily. Too strong for face, underarms, groin    Dispense:  30 g    Refill:  2    Orders Placed This Encounter  Procedures  . Lipid Panel w/o Chol/HDL Ratio  . ALT

## 2015-07-20 NOTE — Assessment & Plan Note (Signed)
Praise given, encouragement to keep up those healthy lifestyle

## 2015-07-20 NOTE — Assessment & Plan Note (Addendum)
Seeing neurologist; since he still needs MRI and EEG, I will defer return to work to neurologist

## 2015-07-20 NOTE — Assessment & Plan Note (Signed)
Completed sleep study, going to have CPAP

## 2015-07-21 ENCOUNTER — Other Ambulatory Visit: Payer: Self-pay | Admitting: Family Medicine

## 2015-07-21 LAB — LIPID PANEL W/O CHOL/HDL RATIO
Cholesterol, Total: 122 mg/dL (ref 100–199)
HDL: 32 mg/dL — AB (ref >39)
LDL Calculated: 65 mg/dL (ref 0–99)
Triglycerides: 127 mg/dL (ref 0–149)
VLDL Cholesterol Cal: 25 mg/dL (ref 5–40)

## 2015-07-21 LAB — ALT: ALT: 31 IU/L (ref 0–44)

## 2015-07-21 MED ORDER — ATORVASTATIN CALCIUM 80 MG PO TABS: 80.0000 mg | ORAL_TABLET | Freq: Every day | ORAL | Status: DC

## 2015-07-21 NOTE — Telephone Encounter (Signed)
LFt vm for patient to call back about his leave of absence. Pts PCP did his leave of absence,and he has EEG and MRI schedule first two week so June 2017.

## 2015-07-21 NOTE — Telephone Encounter (Signed)
Pt called back, he needs a letter on letter head stating his leave of absence has been extended to June 12th.  Case # [id number] , Shelda Jakesttn Lisa [phone] - Fax

## 2015-07-22 ENCOUNTER — Telehealth: Payer: Self-pay | Admitting: Cardiology

## 2015-07-22 NOTE — Telephone Encounter (Signed)
Letter fax to DrydenLisa at [phone] case# [id number]. Leave extended till 08/09/2015. Fax receive and confirm twice.

## 2015-07-22 NOTE — Telephone Encounter (Signed)
LMOVM requesting that pt send manual transmission b/c home monitor has not updated in at least 14 days.    

## 2015-07-30 ENCOUNTER — Ambulatory Visit (INDEPENDENT_AMBULATORY_CARE_PROVIDER_SITE_OTHER): Payer: BLUE CROSS/BLUE SHIELD | Admitting: *Deleted

## 2015-07-30 ENCOUNTER — Encounter: Payer: Self-pay | Admitting: Cardiology

## 2015-07-30 ENCOUNTER — Encounter: Payer: Self-pay | Admitting: Cardiovascular Disease

## 2015-07-30 DIAGNOSIS — I6349 Cerebral infarction due to embolism of other cerebral artery: Secondary | ICD-10-CM

## 2015-07-30 NOTE — Progress Notes (Signed)
Carelink Summary Report / Loop Recorder 

## 2015-08-05 ENCOUNTER — Telehealth: Payer: Self-pay | Admitting: Neurology

## 2015-08-05 ENCOUNTER — Ambulatory Visit (INDEPENDENT_AMBULATORY_CARE_PROVIDER_SITE_OTHER): Payer: BLUE CROSS/BLUE SHIELD | Admitting: Neurology

## 2015-08-05 ENCOUNTER — Telehealth: Payer: Self-pay | Admitting: *Deleted

## 2015-08-05 DIAGNOSIS — R55 Syncope and collapse: Secondary | ICD-10-CM

## 2015-08-05 NOTE — Telephone Encounter (Signed)
Pt return to work letter request on [employer] desk.

## 2015-08-05 NOTE — Telephone Encounter (Signed)
Patient requesting return to work note. He did not have the MRI due a loop recorder. He was told [facility] but has not heard anything about a date for it. He is here today for an EEG and research apt. His best call back is [phone]

## 2015-08-05 NOTE — Telephone Encounter (Signed)
Rn call patient that his EEG was normal. Pt verbalized understanding. 

## 2015-08-05 NOTE — Telephone Encounter (Signed)
Patient is in office today getting EEG and has a research appt today. PT stated he went to [facility] and was told he could not have the MRI because of loop recorder implant. Rn stated per Dr. Pearlean BrownieSethi advice he can [facility] the [facility]. Rn sent message to Duwayne [facility]Danielle at [facility] who [facility]. Pt was given number to call GI for MRI. Pt will call GI once he is done with his EEG and Research appt. PT has letter until next week. Rn will ask Dr. Pearlean BrownieSethi about a letter next week.

## 2015-08-06 ENCOUNTER — Encounter: Payer: Self-pay | Admitting: Cardiology

## 2015-08-06 NOTE — Telephone Encounter (Signed)
Rn stated per Dr. Pearlean BrownieSethi he can return to work on Monday June 12,2017 but he cannot drive. Pt stated a letter needs to be fax to lIsa .

## 2015-08-06 NOTE — Telephone Encounter (Signed)
Fax letter to [employer] for leave from may 27 to August 08, 2015. Fax letter to Misty StanleyLisa at [employer] stating patient can return to work full time June 12,2017. Both faxs receive and confirm twice.

## 2015-08-06 NOTE — Telephone Encounter (Signed)
Rn call patient back that both letter were sent to [employer] for short term disability and Misty StanleyLisa in [employer]. Letter was for return to work and leave of absence from may 27 to June 11. Pt verbalized understanding.Rn stated to patient Dr. Pearlean BrownieSethi will be on vacation week of June 19 thru June 23. Rn stated of they want any forms sign they need to fax it before Wednesday.

## 2015-08-06 NOTE — Telephone Encounter (Signed)
Rn spoke with patient about needing a letter for work. Pt can return on June 12 per Dr. Pearlean BrownieSethi full time but no driving. Rn stated a letter will be fax to LowellLisa at [employer]. PT stated his PCP did he original disability form for leave. But Monia Pouchetna needs something saying his leave was extended from May 27 to June 11.

## 2015-08-06 NOTE — Telephone Encounter (Signed)
Pt called in to check on status of letter please call [phone]

## 2015-08-06 NOTE — Telephone Encounter (Signed)
The patient may return to work but cannot drive.

## 2015-08-08 LAB — CUP PACEART REMOTE DEVICE CHECK: Date Time Interrogation Session: 20170503183547

## 2015-08-08 NOTE — Progress Notes (Signed)
Carelink summary report received. Battery status OK. Normal device function. No new tachy episodes, brady, or pause episodes. No new AF episodes. 3 symptom episodes- #1&#3 episodes appear SR. #2 episode (06/11/15) appears ?tachy/brady- pt went to ED on 06/12/15 with complaints of syncope on the evening of 06/11/15. [facility]. Monthly summary reports and ROV/PRN

## 2015-08-09 ENCOUNTER — Telehealth: Payer: Self-pay | Admitting: *Deleted

## 2015-08-09 NOTE — Telephone Encounter (Signed)
LMOVM requesting call back.  Gave device clinic phone number.  Patient had 11sec pause on 08/05/15, transmitted via manual transmission on 08/08/15.  Will continue to try to reach patient to discuss symptoms.  Dr. Royann Shiversroitoru made aware.

## 2015-08-09 NOTE — Telephone Encounter (Signed)
Able to reach patient.  Patient reports that he was having blood drawn at the time of the episode and he passed out.  Patient reports that "they were taking a lot of blood."  He reports dizziness for a few seconds before he lost consciousness.  Advised patient that I will review with Dr. Royann Shiversroitoru and call him with any further recommendations.  Advised patient to call with worsening symptoms, questions, or concerns.  Patient verbalizes understanding of instructions.

## 2015-08-09 NOTE — Telephone Encounter (Signed)
Called patient with recommendations.  Patient verbalizes understanding of instructions and is appreciative.  He denies additional questions or concerns at this time.

## 2015-08-09 NOTE — Telephone Encounter (Signed)
The gradual slowing of the sinus rate, culminating in a 7" pause and the circumstances of the syncope support a diagnosis of neurally mediated syncope with a predominant cardioinhibitory component. Recommend aggressive hydration and liberalization of salt in his diet, avoidance of triggers (clearly blood drawing is one of them), avoiding heat and prolonged orthostasis without moving. Also needs to be educated about responding immediately to prodromal symptoms and assuming a supine position quickly when they occur. May respond to beta blocker therapy.

## 2015-08-09 NOTE — Telephone Encounter (Signed)
Pt returning your call

## 2015-08-10 ENCOUNTER — Other Ambulatory Visit: Payer: Self-pay | Admitting: Neurology

## 2015-08-10 ENCOUNTER — Encounter (INDEPENDENT_AMBULATORY_CARE_PROVIDER_SITE_OTHER): Payer: Self-pay | Admitting: Neurology

## 2015-08-10 DIAGNOSIS — I639 Cerebral infarction, unspecified: Secondary | ICD-10-CM

## 2015-08-10 NOTE — Progress Notes (Signed)
Pt came in for RESPECT ESUS randomization. Inclusion and exclusion criteria were reviewed again with pt and chart. Pt is eligible for randomization. Will start investigational drugs today and discontinue ASA. Pt expressed understanding. He knows to call us if there are any problems or questions.   Marvel PlanJindong Keisa Blow, MD PhD Stroke Neurology 08/10/2015 1:33 PM

## 2015-08-17 ENCOUNTER — Ambulatory Visit
Admission: RE | Admit: 2015-08-17 | Discharge: 2015-08-17 | Disposition: A | Discharge status: Home / Self Care | Payer: BLUE CROSS/BLUE SHIELD | Source: Ambulatory Visit | Attending: Neurology | Admitting: Neurology

## 2015-08-17 DIAGNOSIS — I639 Cerebral infarction, unspecified: Secondary | ICD-10-CM

## 2015-08-17 DIAGNOSIS — R55 Syncope and collapse: Secondary | ICD-10-CM

## 2015-08-17 MED ORDER — GADOBENATE DIMEGLUMINE 529 MG/ML IV SOLN
20.0000 mL | Freq: Once | INTRAVENOUS | Status: AC | PRN
  Administered 2015-08-17: 20 mL via INTRAVENOUS

## 2015-08-30 ENCOUNTER — Ambulatory Visit (INDEPENDENT_AMBULATORY_CARE_PROVIDER_SITE_OTHER): Payer: BLUE CROSS/BLUE SHIELD | Admitting: *Deleted

## 2015-08-30 DIAGNOSIS — I6349 Cerebral infarction due to embolism of other cerebral artery: Secondary | ICD-10-CM

## 2015-08-30 NOTE — Progress Notes (Signed)
Carelink Summary Report / Loop Recorder 

## 2015-09-03 ENCOUNTER — Telehealth: Payer: Self-pay | Admitting: Cardiology

## 2015-09-03 NOTE — Telephone Encounter (Signed)
LMOVM requesting that pt send manual transmission b/c home monitor has not updated in at least 14 days.    

## 2015-09-07 LAB — CUP PACEART REMOTE DEVICE CHECK: MDC IDC SESS DTM: 20170602190523

## 2015-09-10 ENCOUNTER — Encounter: Payer: Self-pay | Admitting: Cardiology

## 2015-09-17 LAB — CUP PACEART REMOTE DEVICE CHECK: Date Time Interrogation Session: 20170702194114

## 2015-09-20 ENCOUNTER — Ambulatory Visit: Payer: Self-pay | Admitting: Neurology

## 2015-09-20 ENCOUNTER — Telehealth: Payer: Self-pay | Admitting: *Deleted

## 2015-09-20 NOTE — Telephone Encounter (Signed)
No showed follow up appointment. 

## 2015-09-28 ENCOUNTER — Ambulatory Visit (INDEPENDENT_AMBULATORY_CARE_PROVIDER_SITE_OTHER): Payer: BLUE CROSS/BLUE SHIELD | Admitting: *Deleted

## 2015-09-28 DIAGNOSIS — I6349 Cerebral infarction due to embolism of other cerebral artery: Secondary | ICD-10-CM | POA: Diagnosis not present

## 2015-09-29 NOTE — Progress Notes (Signed)
Carelink Summary Report / Loop Recorder 

## 2015-10-06 ENCOUNTER — Telehealth: Payer: Self-pay | Admitting: Cardiology

## 2015-10-06 NOTE — Telephone Encounter (Signed)
LMOVM requesting that pt send manual transmission b/c home monitor has not updated in at least 14 days.    

## 2015-10-07 LAB — CUP PACEART REMOTE DEVICE CHECK: MDC IDC SESS DTM: 20170801201027

## 2015-10-27 ENCOUNTER — Encounter: Payer: Self-pay | Admitting: Neurology

## 2015-10-27 ENCOUNTER — Ambulatory Visit (INDEPENDENT_AMBULATORY_CARE_PROVIDER_SITE_OTHER): Payer: BLUE CROSS/BLUE SHIELD | Admitting: Neurology

## 2015-10-27 VITALS — BP 118/82 | HR 92 | Resp 20 | Ht 66.0 in | Wt 286.0 lb

## 2015-10-27 DIAGNOSIS — Z8673 Personal history of transient ischemic attack (TIA), and cerebral infarction without residual deficits: Secondary | ICD-10-CM | POA: Diagnosis not present

## 2015-10-27 DIAGNOSIS — G4726 Circadian rhythm sleep disorder, shift work type: Secondary | ICD-10-CM

## 2015-10-27 DIAGNOSIS — G4733 Obstructive sleep apnea (adult) (pediatric): Secondary | ICD-10-CM

## 2015-10-27 DIAGNOSIS — Z9989 Dependence on other enabling machines and devices: Principal | ICD-10-CM

## 2015-10-27 DIAGNOSIS — M2619 Other specified anomalies of jaw-cranial base relationship: Secondary | ICD-10-CM

## 2015-10-27 NOTE — Progress Notes (Signed)
SLEEP MEDICINE CLINIC   Provider:  Melvyn Novas, M D  Referring Provider: Kerman Passey, MD Primary Care Physician:  Baruch Gouty, MD  Chief Complaint  Patient presents with  . Follow-up    cpap follow up, going well    HPI:  Austin Fuentes is a 35 y.o. male , seen here as a referral  from Dr. Ferrel Logan for a sleep study and evaluation of risk factors of stroke in the young.   Chief complaint according to patient : " I snore and often have problems to sleep ", wife witnessed apneas.   [name] is a left handed Caucasian, married man, father of 2 girls, who presents here after being hospitalized for stroke in early April. Dr Roda Shutters and Dr Pearlean Brownie have enrolled him into stroke related research trials.  He was seen by [facility] after he suffered a stroke, his risk factors for stroke included obesity, hyperlipidemia mainly cholesterol, ostium secundum atrial septal defect, confirmed by TEE. Currently on aspirin and Lipitor 80 mg. Ejection fraction 60-65% without any wall motion abnormality. Prior to his stroke he suffered mainly of obesity, seasonal allergies and plantar fasciitis but on 05/27/2015 he developed numbness to the left side of his body and vision changes. He had gone to bed the night before without any of the symptoms but woke up noting that his left arm felt numb soon after he realized that his left leg and left side of the face were also not much. He took his daughter to school that morning and then reported to work but had difficulty putting money and appropriate compartments of the Ambulance person. And he felt slower in his thinking processes eventually upon his wife's insistence he finally came to the emergency room. He has never been a smoker and he does not drink alcohol and he denies any recreational drug use there was no swelling to the affected number should extremities and his symptoms had not changed from the morning time when he woke up with them. During his  brief hospitalization a cardiac workup as mentioned above was begun as well as a hypercoagulability panel started and meanwhile returned negative. He was discharged with orders to go to physical therapy occupational therapy and speech therapy, physical therapy was soon found not to be necessary.   Sleep habits are as follows: The patient works as an International aid/development worker for [employer] and has very irregular work hours, sometimes she will start at 6 AM other days at 1 PM which is very hard on the family life as well as on his sleep schedule. He usually tries to be in bed by 10 PM, but often is not able to sleep right away. Some nights he will be lying awake all night. His wife has reported that he began snoring soon after falling asleep and she has witnessed apneas. The couple shares the bedroom, his youngest daughter is 33 months old, the older daughter is three-vessel runoff and often joins the parents in their bed. 2 dogs sleep in the master bed room, too.  The bedroom is cool, dark, quiet. There is an illuminated baby monitor with screen. Mr. Mcdowell sleeps on 2 pillows and a special neck pillow. He usually starts falling asleep on his side but when waking up is more often on his back. He does not have nocturia, some nights he will wake up , most nights he will sleep through. His wife has not reported to him if there  is a positional component to his snoring. During his day and night in the hospital she found him snoring less because the head of the hospital bed was elevated. Earliest rise time is 5 AM , most mornings its 7 AM. The daughters go to day care from 8 o'clock on. Both, Mr and Mrs Uhrich work full time.  Most mornings Mr. Haack will rely on an alarm and he feels not rested or refreshed. Sometimes in a very dry mouth in the morning usually no headaches. Rarely he will nap, but those make him "cranky "- he sleep time is one hour.   Sleep medical history and family sleep history:    Morbid obesity after high school, was "skinny until 3rd grade".  No parent with apnea , 3 brothers, one brain tumor, one with Crohn's disease , all without OSA    Social history: Married, Air cabin crew, 2 daughters,  Very irregular work hours. Often come home at 1 AM and leaves for the day's work at 5 AM.   Interval history from 10/27/2015 Mr. Loeber sleep study took place on 06/30/2015 and was converted to a split-night protocol he was diagnosed with an AHI of 41.1, REM dependent AHI of 78 and slept all night in the supine position CPAP was initiated at 5 cm water pressure and advanced to 12 but the best pressure tolerance was seen at 10 cm water pressure with an AHI of 0.0. I would like to add that Mr. Rudy oxygen nadir was 57 % with a total desaturation time of 109 minutes, and based on this no other treatment option but CPAP existed. I have also the pleasure to see him highly compliant with CPAP use his machine is set at 12 cm water pressure with 3 cm EPR and produces a residual apnea index of only 2.0 he has used the machine 26 out of 30 days 87% of the time. Average user time is short at only 4 hours and 42 minutes but this is related to his very irregular work hours 70-80 hours weekly. His Epworth score is between at 11 points, fatigue severity score was endorsed at 33 points.    Other sources; recent stroke admission HISTORY OF PRESENT ILLNESS [name] is a 35 y.o. male with No past medical history who presents with abnormal sensation of depth perception being off on his left side that started this morning. He states that he was last normal prior to bed last night and subsequently on awakening this morning he reached for his phone and felt like his "left arm felt like jelly." throughout the day he has been reaching for things and they have not been where he expected them to be. Finally, his wife convinced him to come in and be checked out and a head CT revealed a hypodensity in the  right parietal region consistent with infarct.  He denies any recent illnesses other than slight cough for the past couple of weeks. He denies headaches, denies neck pain.  LKW: 3/29 prior to bed tpa given?: no, Out of window Premorbid modified rankin scale: 0  CBC:   Recent Labs Lab 05/27/15 2259  05/29/15 0402 05/31/15 0230  WBC 11.3*  --  8.7 9.3  NEUTROABS 6.6  --  4.7  --   HGB 14.1  < > 13.4 13.8  HCT 43.6  < > 41.5 42.2  MCV 79.6  --  80.1 79.5  PLT 340  --  292 293  < > = values in  this interval not displayed.  Basic Metabolic Panel:   Recent Labs Lab 05/29/15 0402 05/31/15 0230  NA 138 139  K 3.9 4.1  CL 103 105  CO2 27 27  GLUCOSE 96 94  BUN 9 10  CREATININE 0.89 0.89  CALCIUM 9.0 9.0    Lipid Panel:     Component Value Date/Time   CHOL 214* 05/28/2015 0551   TRIG 309* 05/28/2015 0551   HDL 29* 05/28/2015 0551   CHOLHDL 7.4 05/28/2015 0551   VLDL 62* 05/28/2015 0551   LDLCALC 123* 05/28/2015 0551   HgbA1c:  Lab Results  Component Value Date   HGBA1C 5.7* 05/28/2015  The patient was tested for Fabry disease which returned today and negative. forwarded to Dr Roda ShuttersXu.   Review of Systems: Out of a complete 14 system review, the patient complains of only the following symptoms, and all other reviewed systems are negative. Snoring, shift worker,   Epworth score  9  How likely are you to doze in the following situations: 0 = not likely, 1 = slight chance, 2 = moderate chance, 3 = high chance  Sitting and Reading? 2 Watching Television?2 Sitting inactive in a public place (theater or meeting)?2 Lying down in the afternoon when circumstances permit?1 Sitting and talking to someone?1 Sitting quietly after lunch without alcohol?1 In a car, while stopped for a few minutes in traffic?2 As a passenger in a car for an hour without a break?1  Total =11    , Fatigue severity score 33   , depression score N/a   Social History   Social History  .  Marital status: Married    Spouse name: N/A  . Number of children: N/A  . Years of education: N/A   Occupational History  . Not on file.   Social History Main Topics  . Smoking status: Never Smoker  . Smokeless tobacco: Never Used  . Alcohol use No  . Drug use: No  . Sexual activity: No   Other Topics Concern  . Not on file   Social History Narrative  . No narrative on file    Family History  Problem Relation Age of Onset  . Stroke Maternal Grandfather     Past Medical History:  Diagnosis Date  . Allergy   . OSA (obstructive sleep apnea) 07/20/2015   To wear CPAP  . Psoriasis 07/20/2015  . Stroke [facility](HCC)     Past Surgical History:  Procedure Laterality Date  . EP IMPLANTABLE DEVICE N/A 05/31/2015   Procedure: Loop Recorder Insertion;  Surgeon: Thurmon FairMihai Croitoru, MD;  Location: [facility] INVASIVE CV LAB;  Service: Cardiovascular;  Laterality: N/A;  . TEE WITHOUT CARDIOVERSION N/A 05/31/2015   Procedure: TRANSESOPHAGEAL ECHOCARDIOGRAM (TEE);  Surgeon: Thurmon FairMihai Croitoru, MD;  [facility];  Service: Cardiovascular;  Laterality: N/A;  . TONSILLECTOMY    . TONSILLECTOMY      Current Outpatient Prescriptions  Medication Sig Dispense Refill  . atorvastatin (LIPITOR) 80 MG tablet Take 1 tablet (80 mg total) by mouth daily at 6 PM. 30 tablet 2  . fluocinonide ointment (LIDEX) 0.05 % Apply 1 application topically 2 (two) times daily. Too strong for face, underarms, groin 30 g 2  . Coenzyme Q10 (COQ10) 200 MG CAPS Take 1 capsule by mouth daily. (Patient not taking: Reported on 10/27/2015) 100 capsule 1   No current facility-administered medications for this visit.     Allergies as of 10/27/2015  . (No Known Allergies)    Vitals: BP 118/82  Pulse 92   Resp 20   Ht 5\' 6"  (1.676 m)   Wt 286 lb (129.7 kg)   BMI 46.16 kg/m  Last Weight:  Wt Readings from Last 1 Encounters:  10/27/15 286 lb (129.7 kg)   ZOX:WRUE mass index is 46.16 kg/m.     Last Height:   Ht Readings from  Last 1 Encounters:  10/27/15 5\' 6"  (1.676 m)    Physical exam:  General: The patient is awake, alert and appears not in acute distress. The patient is well groomed. Head: Normocephalic, atraumatic. Neck is supple. Mallampati 4 ,  neck circumference:18.5 . Nasal airflow not patent , TMJ is  evident . Severe Retrognathia is seen.  Full facial hair.  Poor dental status, irregular dentition. Crowded jaw.  Cardiovascular:  Regular rate and rhythm, without  murmurs or carotid bruit, and without distended neck veins. Respiratory: Lungs are clear to auscultation. Skin:  Without evidence of edema, or rash Trunk: BMI is elevated. The patient's posture ; droopy shoulders   Neurologic exam : The patient is awake and alert, oriented to place and time.    Attention span & concentration ability appears normal.  Speech is fluent,  without  dysarthria, dysphonia or aphasia.  Mood and affect are appropriate.  Cranial nerves: Pupils are equal and briskly reactive to light. Funduscopic exam without  evidence of pallor or edema. Extraocular movements  in vertical and horizontal planes intact and without nystagmus. Visual fields by finger perimetry are intact. Hearing to finger rub intact. Facial sensation intact to fine touch.  Facial motor strength is symmetric and tongue and uvula move midline. Shoulder shrug was symmetrical.   Motor exam:  Normal tone, muscle bulk and symmetric strength in all extremities. Mr. Payette grip strength is bilaterally good, there is no sign of weakness in his upper extremities and his face is symmetric. Sensory:  Fine touch, pinprick and vibration were tested in all extremities. Proprioception tested in the upper extremities was normal. Numbness resolved.  Coordination: Rapid alternating movements in the fingers/hands was normal. Finger-to-nose maneuver  normal without evidence of ataxia, dysmetria or tremor. Gait and station: Patient walks without assistive device and is able  unassisted to climb up to the exam table. Strength within normal limits. Stance is stable and normal.   Romberg testing is  negative.  Deep tendon reflexes: in the  upper and lower extremities are symmetric and intact. Babinski maneuver response is  downgoing.    Dear Dr. Pearlean Brownie, The patient was advised of the nature of the diagnosed sleep disorder , the treatment options and risks for general a health and wellness arising from not treating the condition.  I spent more than 40 minutes of face to face time with the patient. Greater than 50% of time was spent in counseling and coordination of care. We have discussed the diagnosis and differential and I answered the patient's questions.     Assessment:  After physical and neurologic examination, review of laboratory studies,  Personal review of imaging studies, reports of other /same  Imaging studies ,  Results of polysomnography/ neurophysiology testing and pre-existing records as far as provided in visit., my assessment is   Mr. Lori was diagnosed with a rather smaller stroke in the right parietal lobe-  the impression was that this could be subacute. 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 The MRI confirmed a remote infarct in the superior right cerebellar vermis, a restricted diffusion within the right  parietal lobe which overall is consistent with an acute infarction. And a 6 mm cortical infarct in the left parietal lobe. Hyperlipidemia, hypercoagulability panel and genetic factors have already been evaluated.  1)  associated risk factors with stroke in the young are his severe obstructive sleep apnea, this can lead to atrial fibrillation at night, most strokes occur overnight and are only found symptomatic in the morning. The patient will continue to take his aspirin  2) OSA at 12 cm water with 3 cm flex.  FFM by his request, could not tolerate nasal pillows. Has septal deviation.    3) the patient  also reports insomnia which may be related to his constantly changing work schedules. He is in a similar situation as a jet black person. His day-to-day work schedule changes drastically in his sleep needs and schedule have to adjust.  Plan:  Treatment plan and additional workup : I'm glad that Mr. Gust has found a full face mask that comfort 7, his machine is a ResMed air sense series 10, AutoPap. It is set at 12 cm water pressure with 3 cm EPR and achieved an AHI of only 2.0. This is a very good resolution. 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 I'm satisfied with his average use of 4 hours and 42 minutes. I will see him from now on once a year in follow-up, we will see one of my nurse practitioners.    I  forward a copy to Dr. Pearlean Brownie, primary neurologist and Dr Carlynn Purl as PCP.  Porfirio Mylar Jasneet Schobert MD  10/27/2015   CC: Kerman Passey, Md [address] [place], [place] [address]

## 2015-10-28 ENCOUNTER — Ambulatory Visit (INDEPENDENT_AMBULATORY_CARE_PROVIDER_SITE_OTHER): Payer: BLUE CROSS/BLUE SHIELD | Admitting: *Deleted

## 2015-10-28 DIAGNOSIS — I6349 Cerebral infarction due to embolism of other cerebral artery: Secondary | ICD-10-CM | POA: Diagnosis not present

## 2015-10-28 DIAGNOSIS — G4733 Obstructive sleep apnea (adult) (pediatric): Secondary | ICD-10-CM | POA: Diagnosis not present

## 2015-10-29 NOTE — Progress Notes (Signed)
Carelink Summary Report / Loop Recorder 

## 2015-11-18 ENCOUNTER — Telehealth: Payer: Self-pay | Admitting: Cardiology

## 2015-11-18 NOTE — Telephone Encounter (Signed)
LMOVM requesting that pt send manual transmission b/c home monitor has not updated in at least 14 days.    

## 2015-11-21 LAB — CUP PACEART REMOTE DEVICE CHECK: Date Time Interrogation Session: 20170831204051

## 2015-11-21 NOTE — Progress Notes (Signed)
Carelink summary report received. Battery status OK. Normal device function. No new tachy episodes, brady, or pause episodes. No new AF episodes. 1 symptom episode- previously addressed.Monthly summary reports and ROV/PRN

## 2015-11-27 DIAGNOSIS — G4733 Obstructive sleep apnea (adult) (pediatric): Secondary | ICD-10-CM | POA: Diagnosis not present

## 2015-11-29 ENCOUNTER — Ambulatory Visit (INDEPENDENT_AMBULATORY_CARE_PROVIDER_SITE_OTHER): Payer: BLUE CROSS/BLUE SHIELD | Admitting: Family Medicine

## 2015-11-29 ENCOUNTER — Other Ambulatory Visit: Payer: Self-pay | Admitting: Family Medicine

## 2015-11-29 ENCOUNTER — Encounter: Payer: Self-pay | Admitting: Family Medicine

## 2015-11-29 ENCOUNTER — Ambulatory Visit (INDEPENDENT_AMBULATORY_CARE_PROVIDER_SITE_OTHER): Payer: BLUE CROSS/BLUE SHIELD | Admitting: *Deleted

## 2015-11-29 VITALS — BP 118/68 | HR 85 | Temp 97.8°F | Resp 14 | Wt 283.0 lb

## 2015-11-29 DIAGNOSIS — I6349 Cerebral infarction due to embolism of other cerebral artery: Secondary | ICD-10-CM | POA: Diagnosis not present

## 2015-11-29 DIAGNOSIS — Z23 Encounter for immunization: Secondary | ICD-10-CM | POA: Diagnosis not present

## 2015-11-29 DIAGNOSIS — E782 Mixed hyperlipidemia: Secondary | ICD-10-CM | POA: Diagnosis not present

## 2015-11-29 DIAGNOSIS — I639 Cerebral infarction, unspecified: Secondary | ICD-10-CM | POA: Diagnosis not present

## 2015-11-29 DIAGNOSIS — Q211 Atrial septal defect: Secondary | ICD-10-CM

## 2015-11-29 DIAGNOSIS — G4733 Obstructive sleep apnea (adult) (pediatric): Secondary | ICD-10-CM

## 2015-11-29 DIAGNOSIS — Q2112 Patent foramen ovale: Secondary | ICD-10-CM

## 2015-11-29 DIAGNOSIS — R7303 Prediabetes: Secondary | ICD-10-CM | POA: Diagnosis not present

## 2015-11-29 LAB — CBC WITH DIFFERENTIAL/PLATELET
BASOS ABS: 0 {cells}/uL (ref 0–200)
Basophils Relative: 0 %
EOS ABS: 300 {cells}/uL (ref 15–500)
Eosinophils Relative: 4 %
HEMATOCRIT: 41.7 % (ref 38.5–50.0)
HEMOGLOBIN: 13.3 g/dL (ref 13.2–17.1)
LYMPHS ABS: 2175 {cells}/uL (ref 850–3900)
Lymphocytes Relative: 29 %
MCH: 24.2 pg — AB (ref 27.0–33.0)
MCHC: 31.9 g/dL — AB (ref 32.0–36.0)
MCV: 76 fL — AB (ref 80.0–100.0)
MONO ABS: 525 {cells}/uL (ref 200–950)
MPV: 9 fL (ref 7.5–12.5)
Monocytes Relative: 7 %
NEUTROS PCT: 60 %
Neutro Abs: 4500 cells/uL (ref 1500–7800)
Platelets: 316 10*3/uL (ref 140–400)
RBC: 5.49 MIL/uL (ref 4.20–5.80)
RDW: 14.5 % (ref 11.0–15.0)
WBC: 7.5 10*3/uL (ref 3.8–10.8)

## 2015-11-29 LAB — COMPLETE METABOLIC PANEL WITH GFR
ALT: 17 U/L (ref 9–46)
AST: 16 U/L (ref 10–40)
Albumin: 3.9 g/dL (ref 3.6–5.1)
Alkaline Phosphatase: 72 U/L (ref 40–115)
BUN: 10 mg/dL (ref 7–25)
CALCIUM: 8.7 mg/dL (ref 8.6–10.3)
CHLORIDE: 105 mmol/L (ref 98–110)
CO2: 26 mmol/L (ref 20–31)
Creat: 0.88 mg/dL (ref 0.60–1.35)
GFR, Est Non African American: >89 mL/min (ref >=60)
Glucose, Bld: 94 mg/dL (ref 65–99)
Potassium: 4.3 mmol/L (ref 3.5–5.3)
Sodium: 138 mmol/L (ref 135–146)
Total Bilirubin: 0.5 mg/dL (ref 0.2–1.2)
Total Protein: 6.4 g/dL (ref 6.1–8.1)

## 2015-11-29 LAB — LIPID PANEL
CHOL/HDL RATIO: 5.8 ratio — AB (ref <=5.0)
CHOLESTEROL: 190 mg/dL (ref 125–200)
HDL: 33 mg/dL — AB (ref >=40)
LDL Cholesterol: 131 mg/dL — ABNORMAL HIGH (ref <130)
TRIGLYCERIDES: 130 mg/dL (ref <150)
VLDL: 26 mg/dL (ref <30)

## 2015-11-29 MED ORDER — ATORVASTATIN CALCIUM 40 MG PO TABS: 40.0000 mg | ORAL_TABLET | Freq: Every day | ORAL | 1 refills | Status: DC

## 2015-11-29 NOTE — Patient Instructions (Addendum)
Try to limit saturated fats in your diet (bologna, hot dogs, barbeque, cheeseburgers, hamburgers, steak, bacon, sausage, cheese, etc.) and get more fresh fruits, vegetables, and whole grains  Check out the information at familydoctor.org entitled "Nutrition for Weight Loss: What You Need to Know about Fad Diets" Try to lose between 1-2 pounds per week by taking in fewer calories and burning off more calories You can succeed by limiting portions, limiting foods dense in calories and fat, becoming more active, and drinking 8 glasses of water a day (64 ounces) Don't skip meals, especially breakfast, as skipping meals may alter your metabolism Do not use over-the-counter weight loss pills or gimmicks that claim rapid weight loss A healthy BMI (or body mass index) is between 18.5 and 24.9 You can calculate your ideal BMI at the NIH website [employer].huhttp://www.nhlbi.nih.gov/health/educational/lose_wt/BMI/bmicalc.htm  You received the flu shot today; it should protect you against the flu virus over the coming months; it will take about two weeks for antibodies to develop; do try to stay away from hospitals, nursing homes, and daycares during peak flu season; taking extra vitamin C daily during flu season may help you avoid getting sick

## 2015-11-29 NOTE — Assessment & Plan Note (Signed)
Followed by neurologist; on research study blood thinner

## 2015-11-29 NOTE — Progress Notes (Signed)
Add atorvastatin 40 mg nightly Recheck labs in 6 weeks: CBC, SGPT, lipids

## 2015-11-29 NOTE — Assessment & Plan Note (Signed)
Keep working on weight loss; see AVS; he says he knows what he needs to do

## 2015-11-29 NOTE — Progress Notes (Signed)
Carelink Summary Report / Loop Recorder 

## 2015-11-29 NOTE — Assessment & Plan Note (Signed)
Asymptomatic and no plans for closure unless second stroke; knows to stay active, avoid DVT

## 2015-11-29 NOTE — Assessment & Plan Note (Signed)
Wearing CPAP

## 2015-11-29 NOTE — Progress Notes (Signed)
BP 118/68   Pulse 85   Temp 97.8 F (36.6 C) (Oral)   Resp 14   Wt 283 lb (128.4 kg)   SpO2 96%   BMI 45.68 kg/m    Subjective:    Patient ID: Austin Fuentes, male    DOB: 03/10/1980, 35 y.o.   MRN: [id number]  HPI: Austin Fuentes is a 35 y.o. male  Chief Complaint  Patient presents with  . Follow-up   He is here for followup Hx of stroke; has PFO; seeing cardiologist and neurologist He came fasting for labs Obesity; "I'm terrible at it" when we discussed dieting and weight loss; he skips meals; hard with schedule; he does not drink enough water High cholesterol; eats grilled chicken; does eat cheese, but willing to cut back; does eat veggies; he stopped the statin a couple of months ago; he just ran out and just didn't call for any more Blood pressure controlled without medicine; not adding salt to food He is on a blinded blood thinner through a research study through neurologist OSA, wearing mask most of the time; he averages 4.5 hours of sleep  Depression screen [facility] 2/9 11/29/2015 07/20/2015  Decreased Interest 0 0  Down, Depressed, Hopeless 0 0  PHQ - 2 Score 0 0   Relevant past medical, surgical, family and social history reviewed Past Medical History:  Diagnosis Date  . Allergy   . OSA (obstructive sleep apnea) 07/20/2015   To wear CPAP  . Psoriasis 07/20/2015  . Stroke [facility])    Past Surgical History:  Procedure Laterality Date  . EP IMPLANTABLE DEVICE N/A 05/31/2015   Procedure: Loop Recorder Insertion;  Surgeon: Thurmon Fair, MD;  Location: [facility] INVASIVE CV LAB;  Service: Cardiovascular;  Laterality: N/A;  . TEE WITHOUT CARDIOVERSION N/A 05/31/2015   Procedure: TRANSESOPHAGEAL ECHOCARDIOGRAM (TEE);  Surgeon: Thurmon Fair, MD;  Location[facility] ENDOSCOPY;  Service: Cardiovascular;  Laterality: N/A;  . TONSILLECTOMY    . TONSILLECTOMY     Family History  Problem Relation Age of Onset  . Stroke Maternal Grandfather    Social History  Substance Use Topics  .  Smoking status: Never Smoker  . Smokeless tobacco: Never Used  . Alcohol use No    Interim medical history since last visit reviewed. Allergies and medications reviewed  Review of Systems Per HPI unless specifically indicated above     Objective:    BP 118/68   Pulse 85   Temp 97.8 F (36.6 C) (Oral)   Resp 14   Wt 283 lb (128.4 kg)   SpO2 96%   BMI 45.68 kg/m   Wt Readings from Last 3 Encounters:  11/29/15 283 lb (128.4 kg)  10/27/15 286 lb (129.7 kg)  07/20/15 292 lb 7 oz (132.6 kg)    Physical Exam  Constitutional: He appears well-developed and well-nourished. No distress.  HENT:  Head: Normocephalic and atraumatic.  Eyes: EOM are normal. No scleral icterus.  Neck: No thyromegaly present.  Cardiovascular: Normal rate and regular rhythm.   Pulmonary/Chest: Effort normal and breath sounds normal.  Abdominal: Soft. Bowel sounds are normal. He exhibits no distension.  Musculoskeletal: He exhibits no edema.  Neurological: Coordination normal.  Skin: Skin is warm and dry. No pallor.  Psychiatric: He has a normal mood and affect. His behavior is normal. Judgment and thought content normal.    Results for orders placed or performed in visit on 10/28/15  Implantable device - remote  Result Value Ref Range   Date  Time Interrogation Session [id number]    Pulse Generator Manufacturer MERM    Pulse Gen Model [id number] Reveal LINQ    Pulse Gen Serial Number [id number]    Implantable Pulse Generator Type ICM/ILR    Implantable Pulse Generator Implant Date [id number]    Eval Rhythm SR       Assessment & Plan:   Problem List Items Addressed This Visit      Cardiovascular and Mediastinum   PFO (patent foramen ovale)    Asymptomatic and no plans for closure unless second stroke; knows to stay active, avoid DVT      Cryptogenic stroke (HCC)    Followed by neurologist; on research study blood thinner      Relevant Orders   COMPLETE METABOLIC PANEL WITH GFR       Respiratory   OSA (obstructive sleep apnea)    Wearing CPAP      Relevant Orders   CBC with Differential/Platelet     Other   Severe obesity (BMI >= 40) (HCC)    Keep working on weight loss; see AVS; he says he knows what he needs to do      Prediabetes    Check A1c; keep working on weight loss      Relevant Orders   Hemoglobin A1c   Lipid panel   Hyperlipidemia - Primary    Check fasting lipids      Relevant Orders   Lipid panel    Other Visit Diagnoses    Needs flu shot       Relevant Orders   Flu Vaccine QUAD 36+ mos PF IM (Fluarix & Fluzone Quad PF) (Completed)       Follow up plan: No Follow-up on file.  An after-visit summary was printed and given to the patient at check-out.  Please see the patient instructions which may contain other information and recommendations beyond what is mentioned above in the assessment and plan.  No orders of the defined types were placed in this encounter.   Orders Placed This Encounter  Procedures  . Flu Vaccine QUAD 36+ mos PF IM (Fluarix & Fluzone Quad PF)  . COMPLETE METABOLIC PANEL WITH GFR  . Hemoglobin A1c  . Lipid panel  . CBC with Differential/Platelet

## 2015-11-29 NOTE — Assessment & Plan Note (Signed)
Check fasting lipids 

## 2015-11-29 NOTE — Assessment & Plan Note (Signed)
Check A1c; keep working on weight loss

## 2015-11-30 ENCOUNTER — Other Ambulatory Visit: Payer: Self-pay

## 2015-11-30 DIAGNOSIS — Z5181 Encounter for therapeutic drug level monitoring: Secondary | ICD-10-CM

## 2015-11-30 DIAGNOSIS — R718 Other abnormality of red blood cells: Secondary | ICD-10-CM

## 2015-11-30 DIAGNOSIS — E785 Hyperlipidemia, unspecified: Secondary | ICD-10-CM

## 2015-11-30 LAB — HEMOGLOBIN A1C
HEMOGLOBIN A1C: 5.6 % (ref <5.7)
MEAN PLASMA GLUCOSE: 114 mg/dL

## 2015-12-14 ENCOUNTER — Ambulatory Visit: Payer: BLUE CROSS/BLUE SHIELD | Admitting: Family Medicine

## 2015-12-27 ENCOUNTER — Ambulatory Visit (INDEPENDENT_AMBULATORY_CARE_PROVIDER_SITE_OTHER): Payer: BLUE CROSS/BLUE SHIELD | Admitting: *Deleted

## 2015-12-27 DIAGNOSIS — I6349 Cerebral infarction due to embolism of other cerebral artery: Secondary | ICD-10-CM | POA: Diagnosis not present

## 2015-12-28 DIAGNOSIS — G4733 Obstructive sleep apnea (adult) (pediatric): Secondary | ICD-10-CM | POA: Diagnosis not present

## 2015-12-28 NOTE — Progress Notes (Signed)
Carelink Summary Report / Loop Recorder 

## 2016-01-06 ENCOUNTER — Ambulatory Visit: Payer: BLUE CROSS/BLUE SHIELD | Admitting: Neurology

## 2016-01-08 LAB — CUP PACEART REMOTE DEVICE CHECK
Date Time Interrogation Session: 20170930204743
MDC IDC PG IMPLANT DT: 20170403

## 2016-01-08 NOTE — Progress Notes (Signed)
Carelink summary report received. Battery status OK. Normal device function. 1 symptom episode ECG previously reviewed. No tachy episodes, brady, or pause episodes. No new AF episodes. Monthly summary reports and ROV/PRN 

## 2016-01-26 ENCOUNTER — Ambulatory Visit (INDEPENDENT_AMBULATORY_CARE_PROVIDER_SITE_OTHER): Payer: BLUE CROSS/BLUE SHIELD | Admitting: *Deleted

## 2016-01-26 DIAGNOSIS — I6349 Cerebral infarction due to embolism of other cerebral artery: Secondary | ICD-10-CM | POA: Diagnosis not present

## 2016-01-27 ENCOUNTER — Telehealth: Payer: Self-pay | Admitting: Cardiology

## 2016-01-27 DIAGNOSIS — G4733 Obstructive sleep apnea (adult) (pediatric): Secondary | ICD-10-CM | POA: Diagnosis not present

## 2016-01-27 NOTE — Progress Notes (Signed)
Carelink Summary Report / Loop Recorder 

## 2016-01-27 NOTE — Telephone Encounter (Signed)
LMOVM requesting that pt send manual transmission b/c home monitor has not updated in at least 14 days.    

## 2016-02-01 LAB — CUP PACEART REMOTE DEVICE CHECK
Date Time Interrogation Session: 20171030210555
MDC IDC PG IMPLANT DT: 20170403

## 2016-02-01 NOTE — Progress Notes (Signed)
Carelink summary report received. Battery status OK. Normal device function. No new symptom episodes, tachy episodes, brady, or pause episodes. No new AF episodes. Monthly summary reports and ROV/PRN 

## 2016-02-19 ENCOUNTER — Other Ambulatory Visit: Payer: Self-pay | Admitting: Family Medicine

## 2016-02-25 ENCOUNTER — Ambulatory Visit (INDEPENDENT_AMBULATORY_CARE_PROVIDER_SITE_OTHER): Payer: BLUE CROSS/BLUE SHIELD | Admitting: *Deleted

## 2016-02-25 DIAGNOSIS — I6349 Cerebral infarction due to embolism of other cerebral artery: Secondary | ICD-10-CM | POA: Diagnosis not present

## 2016-02-27 DIAGNOSIS — G4733 Obstructive sleep apnea (adult) (pediatric): Secondary | ICD-10-CM | POA: Diagnosis not present

## 2016-02-29 NOTE — Progress Notes (Signed)
Carelink Summary Report / Loop Recorder 

## 2016-03-01 NOTE — Telephone Encounter (Signed)
Called and left a detail voicemail asking him to come in and get labs done ASAP and mention his approve medications

## 2016-03-01 NOTE — Telephone Encounter (Signed)
Glitch in computer system, box unclicked for refill pool; addressed with IT; addressing old refill requests now --------------------------- Please remind patient that he had labs due a few months ago I'll refill med, but please have labs done fasting ASAP

## 2016-03-06 ENCOUNTER — Ambulatory Visit (INDEPENDENT_AMBULATORY_CARE_PROVIDER_SITE_OTHER): Payer: BLUE CROSS/BLUE SHIELD | Admitting: Family Medicine

## 2016-03-06 ENCOUNTER — Encounter: Payer: Self-pay | Admitting: Family Medicine

## 2016-03-06 VITALS — BP 118/82 | HR 98 | Temp 97.9°F | Resp 14 | Wt 272.2 lb

## 2016-03-06 DIAGNOSIS — I6349 Cerebral infarction due to embolism of other cerebral artery: Secondary | ICD-10-CM

## 2016-03-06 DIAGNOSIS — R718 Other abnormality of red blood cells: Secondary | ICD-10-CM | POA: Diagnosis not present

## 2016-03-06 DIAGNOSIS — E785 Hyperlipidemia, unspecified: Secondary | ICD-10-CM

## 2016-03-06 DIAGNOSIS — M25641 Stiffness of right hand, not elsewhere classified: Secondary | ICD-10-CM

## 2016-03-06 DIAGNOSIS — T300 Burn of unspecified body region, unspecified degree: Secondary | ICD-10-CM

## 2016-03-06 DIAGNOSIS — Z5181 Encounter for therapeutic drug level monitoring: Secondary | ICD-10-CM

## 2016-03-06 DIAGNOSIS — K529 Noninfective gastroenteritis and colitis, unspecified: Secondary | ICD-10-CM | POA: Diagnosis not present

## 2016-03-06 LAB — LIPID PANEL
CHOL/HDL RATIO: 4.9 ratio (ref <5.0)
CHOLESTEROL: 158 mg/dL (ref <200)
HDL: 32 mg/dL — ABNORMAL LOW (ref >40)
LDL Cholesterol: 105 mg/dL — ABNORMAL HIGH (ref <100)
Triglycerides: 104 mg/dL (ref <150)
VLDL: 21 mg/dL (ref <30)

## 2016-03-06 LAB — CBC WITH DIFFERENTIAL/PLATELET
BASOS PCT: 0 %
Basophils Absolute: 0 cells/uL (ref 0–200)
EOS PCT: 3 %
Eosinophils Absolute: 204 cells/uL (ref 15–500)
HEMATOCRIT: 44.7 % (ref 38.5–50.0)
HEMOGLOBIN: 14.1 g/dL (ref 13.2–17.1)
LYMPHS ABS: 1904 {cells}/uL (ref 850–3900)
Lymphocytes Relative: 28 %
MCH: 24.8 pg — ABNORMAL LOW (ref 27.0–33.0)
MCHC: 31.5 g/dL — AB (ref 32.0–36.0)
MCV: 78.7 fL — ABNORMAL LOW (ref 80.0–100.0)
MONO ABS: 544 {cells}/uL (ref 200–950)
MPV: 9 fL (ref 7.5–12.5)
Monocytes Relative: 8 %
NEUTROS PCT: 61 %
Neutro Abs: 4148 cells/uL (ref 1500–7800)
Platelets: 307 10*3/uL (ref 140–400)
RBC: 5.68 MIL/uL (ref 4.20–5.80)
RDW: 14.6 % (ref 11.0–15.0)
WBC: 6.8 10*3/uL (ref 3.8–10.8)

## 2016-03-06 LAB — ALT: ALT: 21 U/L (ref 9–46)

## 2016-03-06 NOTE — Patient Instructions (Addendum)
Try a brace gently flexed on the finger, and let me know if you'd like to see a specialist Keep working on weight loss Let's get labs today If you have not heard anything from my staff in a week about any orders/referrals/studies from today, please contact us here to follow-up [phone] Try to limit saturated fats in your diet (bologna, hot dogs, barbeque, cheeseburgers, hamburgers, steak, bacon, sausage, cheese, etc.) and get more fresh fruits, vegetables, and whole grains
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
## 2016-03-07 LAB — CUP PACEART REMOTE DEVICE CHECK
Date Time Interrogation Session: 20171129220647
Implantable Pulse Generator Implant Date: 20170403

## 2016-03-10 ENCOUNTER — Telehealth: Payer: Self-pay | Admitting: Cardiology

## 2016-03-10 NOTE — Telephone Encounter (Signed)
LMOVM requesting that pt send manual transmission b/c home monitor has not updated in at least 14 days.    

## 2016-03-16 NOTE — Assessment & Plan Note (Signed)
Praised patient for weight loss; encouragement given 

## 2016-03-16 NOTE — Assessment & Plan Note (Signed)
Monitor sgpt on statin 

## 2016-03-16 NOTE — Assessment & Plan Note (Signed)
Followed by neurologist, cardiologist

## 2016-03-16 NOTE — Assessment & Plan Note (Signed)
Check lipids today; work on weight loss, healthy diet

## 2016-03-27 ENCOUNTER — Ambulatory Visit (INDEPENDENT_AMBULATORY_CARE_PROVIDER_SITE_OTHER): Payer: BLUE CROSS/BLUE SHIELD | Admitting: *Deleted

## 2016-03-27 DIAGNOSIS — I6349 Cerebral infarction due to embolism of other cerebral artery: Secondary | ICD-10-CM

## 2016-03-27 NOTE — Progress Notes (Signed)
Carelink Summary Report / Loop Recorder 

## 2016-03-29 ENCOUNTER — Other Ambulatory Visit: Payer: Self-pay | Admitting: Cardiovascular Disease

## 2016-04-08 DIAGNOSIS — L259 Unspecified contact dermatitis, unspecified cause: Secondary | ICD-10-CM | POA: Diagnosis not present

## 2016-04-10 LAB — CUP PACEART REMOTE DEVICE CHECK
Date Time Interrogation Session: 20171229223952
MDC IDC PG IMPLANT DT: 20170403

## 2016-04-25 ENCOUNTER — Ambulatory Visit (INDEPENDENT_AMBULATORY_CARE_PROVIDER_SITE_OTHER): Payer: BLUE CROSS/BLUE SHIELD | Admitting: *Deleted

## 2016-04-25 DIAGNOSIS — I6349 Cerebral infarction due to embolism of other cerebral artery: Secondary | ICD-10-CM | POA: Diagnosis not present

## 2016-04-25 LAB — CUP PACEART REMOTE DEVICE CHECK
Date Time Interrogation Session: 20180128230843
MDC IDC PG IMPLANT DT: 20170403

## 2016-04-25 NOTE — Progress Notes (Signed)
Carelink summary report received. Battery status OK. Normal device function. No new symptom episodes, tachy episodes, brady, or pause episodes. No new AF episodes. Monthly summary reports and ROV/PRN 

## 2016-04-26 NOTE — Progress Notes (Signed)
Carelink Summary Report / Loop Recorder 

## 2016-05-04 ENCOUNTER — Other Ambulatory Visit: Payer: Self-pay | Admitting: Family Medicine

## 2016-05-05 NOTE — Telephone Encounter (Signed)
Left detailed voicemail

## 2016-05-05 NOTE — Telephone Encounter (Signed)
Please see last lab result notes Patient was supposed to call back with decision and it appears he did not Please find out what he wants to do about cholesterol because we are not to goal I sent the 40 mg in case he wants to stay there; if he wants to go to 80 and recheck in 6 weeks, please send back to me; thank you

## 2016-05-10 NOTE — Telephone Encounter (Signed)
I have called for several days and have left several voicemail's with no return call back.

## 2016-05-11 ENCOUNTER — Telehealth: Payer: Self-pay | Admitting: Cardiology

## 2016-05-11 NOTE — Telephone Encounter (Signed)
LMOVM requesting that pt send manual transmission b/c home monitor has not updated in at least 14 days.    

## 2016-05-12 LAB — CUP PACEART REMOTE DEVICE CHECK
MDC IDC PG IMPLANT DT: 20170403
MDC IDC SESS DTM: 20180227230632

## 2016-05-15 ENCOUNTER — Telehealth: Payer: Self-pay | Admitting: *Deleted

## 2016-05-15 NOTE — Telephone Encounter (Signed)
Clinical scenario and rhythm recording both strongly supportive of neurally mediated cardioinhibitory response.

## 2016-05-15 NOTE — Telephone Encounter (Signed)
Pause episode noted on LINQ from 05/07/16 at 1417 (transmitted via manual transmission on 05/13/16 due to disconnected monitor).  Patient reports that he was getting a tattoo at the time of the episode.  Similar symptoms to syncope episode during blood draw in 07/2015.  Patient reports that he experienced brief dizziness, followed by syncope.  He has felt well since this episode.    Advised patient that I will forward to Dr. Royann Shiversroitoru for any additional recommendations, but reiterated avoidance of triggers.  Patient verbalizes understanding and appreciation.

## 2016-05-17 NOTE — Telephone Encounter (Signed)
Spoke with patient and reiterated recommendations from Dr. Royann Shiversroitoru from 08/05/15 syncopal episode, including hydrating, avoidance of triggers, and avoidance of heat.  Encouraged patient to lie down for episodes like this in the future to reduce risk of injury if he has prodromal symptoms.  Patient verbalizes understanding and appreciation.  He denies additional questions or concerns at this time.

## 2016-05-25 ENCOUNTER — Ambulatory Visit (INDEPENDENT_AMBULATORY_CARE_PROVIDER_SITE_OTHER): Payer: BLUE CROSS/BLUE SHIELD | Admitting: *Deleted

## 2016-05-25 DIAGNOSIS — I6349 Cerebral infarction due to embolism of other cerebral artery: Secondary | ICD-10-CM | POA: Diagnosis not present

## 2016-05-26 NOTE — Progress Notes (Signed)
Carelink Summary Report / Loop Recorder 

## 2016-06-01 ENCOUNTER — Telehealth: Payer: Self-pay | Admitting: Cardiology

## 2016-06-01 NOTE — Telephone Encounter (Signed)
LMOVM requesting that pt send manual transmission b/c home monitor has not updated in at least 14 days.    

## 2016-06-05 ENCOUNTER — Other Ambulatory Visit: Payer: Self-pay | Admitting: Cardiovascular Disease

## 2016-06-08 ENCOUNTER — Encounter: Payer: Self-pay | Admitting: Cardiology

## 2016-06-09 LAB — CUP PACEART REMOTE DEVICE CHECK
Date Time Interrogation Session: 20180329233700
MDC IDC PG IMPLANT DT: 20170403

## 2016-06-22 ENCOUNTER — Telehealth: Payer: Self-pay | Admitting: Cardiology

## 2016-06-22 NOTE — Telephone Encounter (Signed)
LMOVM requesting that pt send manual transmission b/c home monitor has not updated in at least 14 days.    

## 2016-06-26 ENCOUNTER — Ambulatory Visit (INDEPENDENT_AMBULATORY_CARE_PROVIDER_SITE_OTHER): Payer: BLUE CROSS/BLUE SHIELD | Admitting: *Deleted

## 2016-06-26 DIAGNOSIS — I6349 Cerebral infarction due to embolism of other cerebral artery: Secondary | ICD-10-CM | POA: Diagnosis not present

## 2016-06-26 NOTE — Progress Notes (Signed)
Carelink Summary Report / Loop Recorder 

## 2016-06-29 ENCOUNTER — Encounter: Payer: Self-pay | Admitting: Cardiology

## 2016-07-08 LAB — CUP PACEART REMOTE DEVICE CHECK
MDC IDC PG IMPLANT DT: 20170403
MDC IDC SESS DTM: 20180428233729

## 2016-07-08 NOTE — Progress Notes (Signed)
Carelink summary report received. Battery status OK. Normal device function. No new symptom episodes, tachy episodes, brady, or pause episodes. No new AF episodes. Monthly summary reports and ROV/PRN 

## 2016-07-25 ENCOUNTER — Ambulatory Visit (INDEPENDENT_AMBULATORY_CARE_PROVIDER_SITE_OTHER): Payer: BLUE CROSS/BLUE SHIELD | Admitting: *Deleted

## 2016-07-25 DIAGNOSIS — I6349 Cerebral infarction due to embolism of other cerebral artery: Secondary | ICD-10-CM

## 2016-07-25 NOTE — Progress Notes (Signed)
Carelink Summary Report / Loop Recorder 

## 2016-07-27 LAB — CUP PACEART REMOTE DEVICE CHECK
Date Time Interrogation Session: 20180528233739
MDC IDC PG IMPLANT DT: 20170403

## 2016-07-27 NOTE — Progress Notes (Signed)
Carelink summary report received. Battery status OK. Normal device function. No new tachy episodes, brady, or pause episodes. No new AF episodes. 1 symptom episode- appears SR throughout w/ artifact noted. Monthly summary reports and ROV/PRN

## 2016-08-08 ENCOUNTER — Encounter: Payer: Self-pay | Admitting: Diagnostic Neuroimaging

## 2016-08-08 DIAGNOSIS — I639 Cerebral infarction, unspecified: Secondary | ICD-10-CM

## 2016-08-08 NOTE — Progress Notes (Signed)
RESPECT ESUS study visit - end of study visit  Patient here for final visit. Doing well. No new issues. Neuro exam unremarkable. Patient now started on aspirin 325mg  daily.  Suanne MarkerVIKRAM R. Reford Olliff, MD 08/08/2016, 4:50 PM Certified in Neurology, Neurophysiology and Neuroimaging  [facility] [address], [address] [place], [place] [phone]

## 2016-08-23 ENCOUNTER — Ambulatory Visit (INDEPENDENT_AMBULATORY_CARE_PROVIDER_SITE_OTHER): Payer: BLUE CROSS/BLUE SHIELD | Admitting: *Deleted

## 2016-08-23 DIAGNOSIS — I6349 Cerebral infarction due to embolism of other cerebral artery: Secondary | ICD-10-CM | POA: Diagnosis not present

## 2016-08-24 NOTE — Progress Notes (Signed)
Carelink Summary Report / Loop Recorder 

## 2016-09-03 LAB — CUP PACEART REMOTE DEVICE CHECK
Implantable Pulse Generator Implant Date: 20170403
MDC IDC SESS DTM: 20180628000911

## 2016-09-03 NOTE — Progress Notes (Signed)
Carelink summary report received. Battery status OK. Normal device function. No new tachy episodes, brady, or pause episodes. No new AF episodes. 1 symptom- ECG appears SR throughout. Monthly summary reports and ROV/PRN 

## 2016-09-05 ENCOUNTER — Ambulatory Visit (INDEPENDENT_AMBULATORY_CARE_PROVIDER_SITE_OTHER): Payer: BLUE CROSS/BLUE SHIELD | Admitting: Family Medicine

## 2016-09-05 ENCOUNTER — Encounter: Payer: Self-pay | Admitting: Family Medicine

## 2016-09-05 VITALS — BP 118/78 | HR 90 | Temp 98.0°F | Resp 14 | Wt 283.0 lb

## 2016-09-05 DIAGNOSIS — R7303 Prediabetes: Secondary | ICD-10-CM

## 2016-09-05 DIAGNOSIS — Z5181 Encounter for therapeutic drug level monitoring: Secondary | ICD-10-CM | POA: Diagnosis not present

## 2016-09-05 DIAGNOSIS — M79644 Pain in right finger(s): Secondary | ICD-10-CM

## 2016-09-05 DIAGNOSIS — E782 Mixed hyperlipidemia: Secondary | ICD-10-CM

## 2016-09-05 DIAGNOSIS — Z8673 Personal history of transient ischemic attack (TIA), and cerebral infarction without residual deficits: Secondary | ICD-10-CM

## 2016-09-05 MED ORDER — ATORVASTATIN CALCIUM 40 MG PO TABS: 40.0000 mg | ORAL_TABLET | Freq: Every day | ORAL | 0 refills | Status: DC

## 2016-09-05 NOTE — Assessment & Plan Note (Signed)
No new symptoms; encouraged daily aspirin; f/u with neuro

## 2016-09-05 NOTE — Patient Instructions (Addendum)
Try turmeric as a natural anti-inflammatory (for pain and arthritis). It comes in capsules where you buy aspirin and fish oil, but also as a spice where you buy pepper and garlic powder. Try that for aches and pain If you need something for aches or pains, try to use Tylenol (acetaminophen) instead of non-steroidals (which include Aleve, ibuprofen, Advil, Motrin, and naproxen); non-steroidals can cause long-term kidney damage and increase risk of stroke Try to limit saturated fats in your diet (bologna, hot dogs, barbeque, cheeseburgers, hamburgers, steak, bacon, sausage, cheese, etc.) and get more fresh fruits, vegetables, and whole grains Return for fasting labs only six weeks after you've started back on the cholesterol medicine Check out the information at familydoctor.org entitled "Nutrition for Weight Loss: What You Need to Know about Fad Diets" Try to lose between 1-2 pounds per week by taking in fewer calories and burning off more calories You can succeed by limiting portions, limiting foods dense in calories and fat, becoming more active, and drinking 8 glasses of water a day (64 ounces) Don't skip meals, especially breakfast, as skipping meals may alter your metabolism Do not use over-the-counter weight loss pills or gimmicks that claim rapid weight loss A healthy BMI (or body mass index) is between 18.5 and 24.9 You can calculate your ideal BMI at the NIH website Job[employer]huhttp://www.nhlbi.nih.gov/health/educational/lose_wt/BMI/bmicalc.htm Let's aim to see your weight down at least 12 pounds by next visit

## 2016-09-05 NOTE — Assessment & Plan Note (Signed)
Check sgpt 

## 2016-09-05 NOTE — Assessment & Plan Note (Signed)
Will check fasting gluose and A1c in 6 weeks when he has been back on cholesterol medicine; limit sugary drinks, continue exercise

## 2016-09-05 NOTE — Assessment & Plan Note (Signed)
Encouraged weight loss 

## 2016-09-05 NOTE — Progress Notes (Signed)
BP 118/78   Pulse 90   Temp 98 F (36.7 C) (Oral)   Resp 14   Wt 283 lb (128.4 kg)   SpO2 94%   BMI 45.68 kg/m    Subjective:    Patient ID: Austin Fuentes, male    DOB: Jul 24, 1980, 36 y.o.   MRN: [id number]  HPI: Austin Fuentes is a 36 y.o. male  Chief Complaint  Patient presents with  . Follow-up    6 month  . Hand Pain    right hand ring finger    HPI He is here for follow-up Loop recorder monitored by cardiologist Seeing neurologist for stroke hx; no new symptoms  He needs cholesterol prescription; e-scripts; he ran out of his cholesterol months ago  Not doing well with his diet; he has been "bad"; gets 2.5 to 3 miles walking at the gym; work schedule, kids, busy  He is done with the study; they have enough information; last labs were a month ago  He is having problem with the right ring finger He had an allergic reaction to sulfa a few months ago, was on prednisone, and that helped his finger; wonders if anything to take; he has not seen a hand specialist, no xrays  Left-handed  Prediabetes; no dry mouth, no blurred vision; not many sweetened drinks  Depression screen [facility] 2/9 09/05/2016 03/06/2016 11/29/2015 07/20/2015  Decreased Interest 0 0 0 0  Down, Depressed, Hopeless 0 0 0 0  PHQ - 2 Score 0 0 0 0    Relevant past medical, surgical, family and social history reviewed Past Medical History:  Diagnosis Date  . Allergy   . OSA (obstructive sleep apnea) 07/20/2015   To wear CPAP  . Psoriasis 07/20/2015  . Stroke [facility])    Past Surgical History:  Procedure Laterality Date  . EP IMPLANTABLE DEVICE N/A 05/31/2015   Procedure: Loop Recorder Insertion;  Surgeon: Thurmon Fair, MD;  Location: [facility] INVAS[facility] CV LAB;  Service: Cardiovascular;  Laterality: N/A;  . TEE WITHOUT CARDIOVERSION N/A 05/31/2015   Procedure: TRANSESOPHAGEAL ECHOCARDIOGRAM (TEE);  Surgeon: Thurmon Fair, MD;  Location: [facility] ENDOSCOPY;  Service: Cardiovascular;  Laterality: N/A;  . TONSILLECTOMY      Family History  Problem Relation Age of Onset  . Cancer Paternal Grandmother        breast/lung  . Stroke Paternal Grandfather    Social History   Social History  . Marital status: Married    Spouse name: N/A  . Number of children: N/A  . Years of education: N/A   Occupational History  . Not on file.   Social History Main Topics  . Smoking status: Never Smoker  . Smokeless tobacco: Never Used  . Alcohol use No  . Drug use: No  . Sexual activity: Not Currently   Other Topics Concern  . Not on file   Social History Narrative  . No narrative on file    Interim medical history since last visit reviewed. Allergies and medications reviewed  Review of Systems Per HPI unless specifically indicated above     Objective:    BP 118/78   Pulse 90   Temp 98 F (36.7 C) (Oral)   Resp 14   Wt 283 lb (128.4 kg)   SpO2 94%   BMI 45.68 kg/m   Wt Readings from Last 3 Encounters:  09/05/16 283 lb (128.4 kg)  03/06/16 272 lb 4 oz (123.5 kg)  11/29/15 283 lb (128.4 kg)    Physical  Exam  Constitutional: He appears well-developed and well-nourished. No distress.  Morbidly obese, weight gain noted  Eyes: EOM are normal. No scleral icterus.  Neck: No thyromegaly present.  Cardiovascular: Normal rate and regular rhythm.   Pulmonary/Chest: Effort normal and breath sounds normal.  Abdominal: Soft. There is no tenderness.  Musculoskeletal:       Right hand: He exhibits decreased range of motion and tenderness.       Hands: Right ring finger, between MCP and PIP, there is mild swelling, tenderness; limited ROM with flexion; no erythema; no palpable nodules  Skin: No pallor.  Psychiatric: He has a normal mood and affect. His mood appears not anxious. He does not exhibit a depressed mood.   Results for orders placed or performed in visit on 08/23/16  CUP PACEART REMOTE DEVICE CHECK  Result Value Ref Range   Date Time Interrogation Session [id number]    Pulse Generator  Manufacturer MERM    Pulse Gen Model [id number] Reveal LINQ    Pulse Gen Serial Number [id number]    [facility]    Implantable Pulse Generator Type ICM/ILR    Implantable Pulse Generator Implant Date [id number]    Eval Rhythm SR       Assessment & Plan:   Problem List Items Addressed This Visit      Other   Severe obesity (BMI >= 40) (HCC)    Encouraged weight loss      Prediabetes    Will check fasting gluose and A1c in 6 weeks when he has been back on cholesterol medicine; limit sugary drinks, continue exercise      Relevant Orders   Hemoglobin A1c   Medication monitoring encounter    Check sgpt      Relevant Orders   COMPLETE METABOLIC PANEL WITH GFR   CBC with Differential/Platelet   Hyperlipidemia    Start back on statin; recheck labs in 6 weeks, limit saturated fats      Relevant Medications   aspirin 325 MG tablet   atorvastatin (LIPITOR) 40 MG tablet   Other Relevant Orders   Lipid panel   History of stroke    No new symptoms; encouraged daily aspirin; f/u with neuro       Other Visit Diagnoses    Finger pain, right    -  Primary   Relevant Orders   Ambulatory referral to Orthopedic Surgery       Follow up plan: Return in about 6 months (around 03/08/2017).  An after-visit summary was printed and given to the patient at check-out.  Please see the patient instructions which may contain other information and recommendations beyond what is mentioned above in the assessment and plan.  Meds ordered this encounter  Medications  . aspirin 325 MG tablet    Sig: Take 325 mg by mouth daily.  Marland Kitchen. atorvastatin (LIPITOR) 40 MG tablet    Sig: Take 1 tablet (40 mg total) by mouth at bedtime.    Dispense:  90 tablet    Refill:  0    Orders Placed This Encounter  Procedures  . COMPLETE METABOLIC PANEL WITH GFR  . CBC with Differential/Platelet  . Hemoglobin A1c  . Lipid panel  . Ambulatory referral to Orthopedic Surgery

## 2016-09-05 NOTE — Assessment & Plan Note (Signed)
Start back on statin; recheck labs in 6 weeks, limit saturated fats

## 2016-09-12 DIAGNOSIS — M65331 Trigger finger, right middle finger: Secondary | ICD-10-CM | POA: Diagnosis not present

## 2016-09-20 ENCOUNTER — Telehealth: Payer: Self-pay | Admitting: Cardiology

## 2016-09-20 NOTE — Telephone Encounter (Signed)
LMOVM requesting that pt send manual transmission b/c home monitor has not updated in at least 14 days.    

## 2016-09-22 ENCOUNTER — Ambulatory Visit (INDEPENDENT_AMBULATORY_CARE_PROVIDER_SITE_OTHER): Payer: BLUE CROSS/BLUE SHIELD | Admitting: *Deleted

## 2016-09-22 DIAGNOSIS — I6349 Cerebral infarction due to embolism of other cerebral artery: Secondary | ICD-10-CM | POA: Diagnosis not present

## 2016-09-25 NOTE — Progress Notes (Signed)
Carelink Summary Report / Loop Recorder 

## 2016-10-05 ENCOUNTER — Telehealth: Payer: Self-pay | Admitting: Cardiology

## 2016-10-05 LAB — CUP PACEART REMOTE DEVICE CHECK
Date Time Interrogation Session: 20180728003732
Implantable Pulse Generator Implant Date: 20170403

## 2016-10-05 NOTE — Telephone Encounter (Signed)
LMOVM requesting that pt send manual transmission b/c home monitor has not updated in at least 7 days.    

## 2016-10-23 ENCOUNTER — Ambulatory Visit (INDEPENDENT_AMBULATORY_CARE_PROVIDER_SITE_OTHER): Payer: BLUE CROSS/BLUE SHIELD | Admitting: *Deleted

## 2016-10-23 DIAGNOSIS — I6349 Cerebral infarction due to embolism of other cerebral artery: Secondary | ICD-10-CM

## 2016-10-23 NOTE — Progress Notes (Signed)
Carelink Summary Report / Loop Recorder 

## 2016-10-24 ENCOUNTER — Other Ambulatory Visit: Payer: Self-pay | Admitting: Family Medicine

## 2016-10-24 NOTE — Telephone Encounter (Signed)
Left detailed voicemail

## 2016-10-24 NOTE — Telephone Encounter (Signed)
Patient is due for fasting labs Please ask him to come in tomorrow or Thursday morning so I'll have those results before I leave I'll approve Rx but we may need to adjust the dose

## 2016-10-26 ENCOUNTER — Ambulatory Visit: Payer: BLUE CROSS/BLUE SHIELD | Admitting: Adult Health

## 2016-10-26 ENCOUNTER — Other Ambulatory Visit: Payer: Self-pay

## 2016-10-26 DIAGNOSIS — E782 Mixed hyperlipidemia: Secondary | ICD-10-CM

## 2016-10-26 DIAGNOSIS — R7303 Prediabetes: Secondary | ICD-10-CM

## 2016-10-26 DIAGNOSIS — Z5181 Encounter for therapeutic drug level monitoring: Secondary | ICD-10-CM | POA: Diagnosis not present

## 2016-10-26 LAB — CBC WITH DIFFERENTIAL/PLATELET
BASOS PCT: 0 %
Basophils Absolute: 0 cells/uL (ref 0–200)
EOS ABS: 516 {cells}/uL — AB (ref 15–500)
Eosinophils Relative: 6 %
HEMATOCRIT: 42.8 % (ref 38.5–50.0)
HEMOGLOBIN: 13.6 g/dL (ref 13.2–17.1)
LYMPHS ABS: 2580 {cells}/uL (ref 850–3900)
Lymphocytes Relative: 30 %
MCH: 25 pg — ABNORMAL LOW (ref 27.0–33.0)
MCHC: 31.8 g/dL — ABNORMAL LOW (ref 32.0–36.0)
MCV: 78.7 fL — AB (ref 80.0–100.0)
MONO ABS: 602 {cells}/uL (ref 200–950)
MPV: 8.9 fL (ref 7.5–12.5)
Monocytes Relative: 7 %
NEUTROS ABS: 4902 {cells}/uL (ref 1500–7800)
Neutrophils Relative %: 57 %
Platelets: 307 10*3/uL (ref 140–400)
RBC: 5.44 MIL/uL (ref 4.20–5.80)
RDW: 14.5 % (ref 11.0–15.0)
WBC: 8.6 10*3/uL (ref 3.8–10.8)

## 2016-10-27 LAB — LIPID PANEL
CHOL/HDL RATIO: 3.8 ratio (ref <5.0)
CHOLESTEROL: 136 mg/dL (ref <200)
HDL: 36 mg/dL — ABNORMAL LOW (ref >40)
LDL Cholesterol: 82 mg/dL (ref <100)
TRIGLYCERIDES: 88 mg/dL (ref <150)
VLDL: 18 mg/dL (ref <30)

## 2016-10-27 LAB — HEMOGLOBIN A1C
Hgb A1c MFr Bld: 5.5 % (ref <5.7)
Mean Plasma Glucose: 111 mg/dL

## 2016-10-27 LAB — COMPLETE METABOLIC PANEL WITH GFR
ALT: 25 U/L (ref 9–46)
AST: 18 U/L (ref 10–40)
Albumin: 4 g/dL (ref 3.6–5.1)
Alkaline Phosphatase: 88 U/L (ref 40–115)
BUN: 15 mg/dL (ref 7–25)
CHLORIDE: 105 mmol/L (ref 98–110)
CO2: 22 mmol/L (ref 20–32)
CREATININE: 1.05 mg/dL (ref 0.60–1.35)
Calcium: 9 mg/dL (ref 8.6–10.3)
Glucose, Bld: 96 mg/dL (ref 65–99)
Potassium: 4.6 mmol/L (ref 3.5–5.3)
Sodium: 140 mmol/L (ref 135–146)
Total Bilirubin: 0.6 mg/dL (ref 0.2–1.2)
Total Protein: 6.6 g/dL (ref 6.1–8.1)

## 2016-10-27 LAB — CUP PACEART REMOTE DEVICE CHECK
Date Time Interrogation Session: 20180827013749
MDC IDC PG IMPLANT DT: 20170403

## 2016-11-08 ENCOUNTER — Telehealth: Payer: Self-pay | Admitting: Cardiology

## 2016-11-08 NOTE — Telephone Encounter (Signed)
LMOVM requesting that pt send manual transmission b/c home monitor has not updated in at least 14 days.    

## 2016-11-21 ENCOUNTER — Ambulatory Visit (INDEPENDENT_AMBULATORY_CARE_PROVIDER_SITE_OTHER): Payer: BLUE CROSS/BLUE SHIELD | Admitting: *Deleted

## 2016-11-21 DIAGNOSIS — I6349 Cerebral infarction due to embolism of other cerebral artery: Secondary | ICD-10-CM

## 2016-11-22 NOTE — Progress Notes (Signed)
Carelink Summary Report / Loop Recorder 

## 2016-11-24 LAB — CUP PACEART REMOTE DEVICE CHECK
Implantable Pulse Generator Implant Date: 20170403
MDC IDC SESS DTM: 20180926020731

## 2016-12-21 ENCOUNTER — Ambulatory Visit (INDEPENDENT_AMBULATORY_CARE_PROVIDER_SITE_OTHER): Payer: BLUE CROSS/BLUE SHIELD | Admitting: *Deleted

## 2016-12-21 DIAGNOSIS — I6349 Cerebral infarction due to embolism of other cerebral artery: Secondary | ICD-10-CM

## 2016-12-22 NOTE — Progress Notes (Signed)
Carelink Summary Report / Loop Recorder 

## 2016-12-26 LAB — CUP PACEART REMOTE DEVICE CHECK
Implantable Pulse Generator Implant Date: 20170403
MDC IDC SESS DTM: 20181026023725

## 2017-01-03 ENCOUNTER — Telehealth: Payer: Self-pay | Admitting: Cardiology

## 2017-01-03 NOTE — Telephone Encounter (Signed)
LMOVM requesting that pt send manual transmission b/c home monitor has not updated in at least 14 days.    

## 2017-01-11 ENCOUNTER — Encounter: Payer: Self-pay | Admitting: Cardiology

## 2017-01-22 ENCOUNTER — Ambulatory Visit (INDEPENDENT_AMBULATORY_CARE_PROVIDER_SITE_OTHER): Payer: BLUE CROSS/BLUE SHIELD | Admitting: *Deleted

## 2017-01-22 DIAGNOSIS — I6349 Cerebral infarction due to embolism of other cerebral artery: Secondary | ICD-10-CM

## 2017-01-22 NOTE — Progress Notes (Signed)
Carelink Summary Report / Loop Recorder 

## 2017-02-05 LAB — CUP PACEART REMOTE DEVICE CHECK
Date Time Interrogation Session: 20181125051021
MDC IDC PG IMPLANT DT: 20170403

## 2017-02-06 ENCOUNTER — Other Ambulatory Visit: Payer: Self-pay | Admitting: Family Medicine

## 2017-02-06 NOTE — Telephone Encounter (Signed)
Last sgpt and lipids reviewed; Rx approved 

## 2017-02-21 ENCOUNTER — Encounter: Payer: BLUE CROSS/BLUE SHIELD | Admitting: *Deleted

## 2017-03-08 ENCOUNTER — Ambulatory Visit: Payer: BLUE CROSS/BLUE SHIELD | Admitting: Family Medicine

## 2017-04-14 NOTE — Progress Notes (Signed)
Closing out lab/order note open since:  10/26/16

## 2017-04-14 NOTE — Progress Notes (Signed)
Closing out lab/order note open since:  Oct 2017

## 2017-04-14 NOTE — Telephone Encounter (Signed)
Closing out old note from 01/2016 Patient was seen in Jan 2018 and had labs done at that appointment Issue is moot, signing off

## 2017-06-13 ENCOUNTER — Telehealth: Payer: Self-pay | Admitting: Cardiology

## 2017-06-13 NOTE — Telephone Encounter (Signed)
LMOVM requesting that pt send manual transmission b/c home monitor has not updated in at least 14 days.    

## 2017-06-19 ENCOUNTER — Encounter: Payer: Self-pay | Admitting: Cardiology

## 2017-06-29 ENCOUNTER — Ambulatory Visit (INDEPENDENT_AMBULATORY_CARE_PROVIDER_SITE_OTHER): Payer: BLUE CROSS/BLUE SHIELD | Admitting: *Deleted

## 2017-06-29 DIAGNOSIS — I6349 Cerebral infarction due to embolism of other cerebral artery: Secondary | ICD-10-CM

## 2017-07-02 NOTE — Progress Notes (Signed)
Carelink Summary Report / Loop Recorder 

## 2017-07-18 ENCOUNTER — Telehealth: Payer: Self-pay | Admitting: Cardiology

## 2017-07-18 NOTE — Telephone Encounter (Signed)
LMOVM requesting that pt send manual transmission b/c home monitor has not updated in at least 14 days.    

## 2017-07-24 LAB — CUP PACEART REMOTE DEVICE CHECK
Date Time Interrogation Session: 20190503160604
Implantable Pulse Generator Implant Date: 20170403

## 2017-07-27 ENCOUNTER — Encounter: Payer: Self-pay | Admitting: Cardiology

## 2017-08-01 ENCOUNTER — Ambulatory Visit (INDEPENDENT_AMBULATORY_CARE_PROVIDER_SITE_OTHER): Payer: BLUE CROSS/BLUE SHIELD | Admitting: *Deleted

## 2017-08-01 DIAGNOSIS — I6349 Cerebral infarction due to embolism of other cerebral artery: Secondary | ICD-10-CM

## 2017-08-01 NOTE — Progress Notes (Signed)
Carelink Summary Report / Loop Recorder 

## 2017-08-14 ENCOUNTER — Telehealth: Payer: Self-pay | Admitting: Cardiology

## 2017-08-14 NOTE — Telephone Encounter (Signed)
LMOVM requesting that pt send manual transmission b/c home monitor has not updated in at least 14 days.    

## 2017-08-27 ENCOUNTER — Encounter: Payer: Self-pay | Admitting: Cardiology

## 2017-09-03 ENCOUNTER — Encounter: Payer: BLUE CROSS/BLUE SHIELD | Admitting: *Deleted

## 2017-09-06 LAB — CUP PACEART REMOTE DEVICE CHECK
Date Time Interrogation Session: 20190605163930
Implantable Pulse Generator Implant Date: 20170403

## 2018-01-01 ENCOUNTER — Ambulatory Visit
Admission: RE | Admit: 2018-01-01 | Discharge: 2018-01-01 | Disposition: A | Discharge status: Home / Self Care | Payer: BLUE CROSS/BLUE SHIELD | Source: Ambulatory Visit | Attending: Family Medicine | Admitting: Family Medicine

## 2018-01-01 ENCOUNTER — Encounter: Payer: Self-pay | Admitting: Family Medicine

## 2018-01-01 ENCOUNTER — Ambulatory Visit (INDEPENDENT_AMBULATORY_CARE_PROVIDER_SITE_OTHER): Payer: BLUE CROSS/BLUE SHIELD | Admitting: Family Medicine

## 2018-01-01 VITALS — BP 118/74 | HR 91 | Temp 97.7°F | Wt 273.9 lb

## 2018-01-01 DIAGNOSIS — E782 Mixed hyperlipidemia: Secondary | ICD-10-CM

## 2018-01-01 DIAGNOSIS — M2550 Pain in unspecified joint: Secondary | ICD-10-CM

## 2018-01-01 DIAGNOSIS — M79642 Pain in left hand: Secondary | ICD-10-CM | POA: Diagnosis not present

## 2018-01-01 DIAGNOSIS — Z23 Encounter for immunization: Secondary | ICD-10-CM

## 2018-01-01 DIAGNOSIS — Z0000 Encounter for general adult medical examination without abnormal findings: Secondary | ICD-10-CM | POA: Insufficient documentation

## 2018-01-01 DIAGNOSIS — M79641 Pain in right hand: Secondary | ICD-10-CM | POA: Insufficient documentation

## 2018-01-01 DIAGNOSIS — R7303 Prediabetes: Secondary | ICD-10-CM

## 2018-01-01 DIAGNOSIS — L409 Psoriasis, unspecified: Secondary | ICD-10-CM | POA: Insufficient documentation

## 2018-01-01 DIAGNOSIS — M7989 Other specified soft tissue disorders: Secondary | ICD-10-CM | POA: Diagnosis not present

## 2018-01-01 DIAGNOSIS — R718 Other abnormality of red blood cells: Secondary | ICD-10-CM | POA: Diagnosis not present

## 2018-01-01 IMAGING — CR DG HAND COMPLETE 3+V*L*
1 series · 3 of 3 positions shown · non-contrast
Comparison: No prior.

CLINICAL DATA: Right hand pain and swelling.

EXAM:
LEFT HAND - COMPLETE 3+ VIEW

[Series 1: dg hand complete left · 0.14mm/px · 3 of 3 slices shown]
[im 1/3]
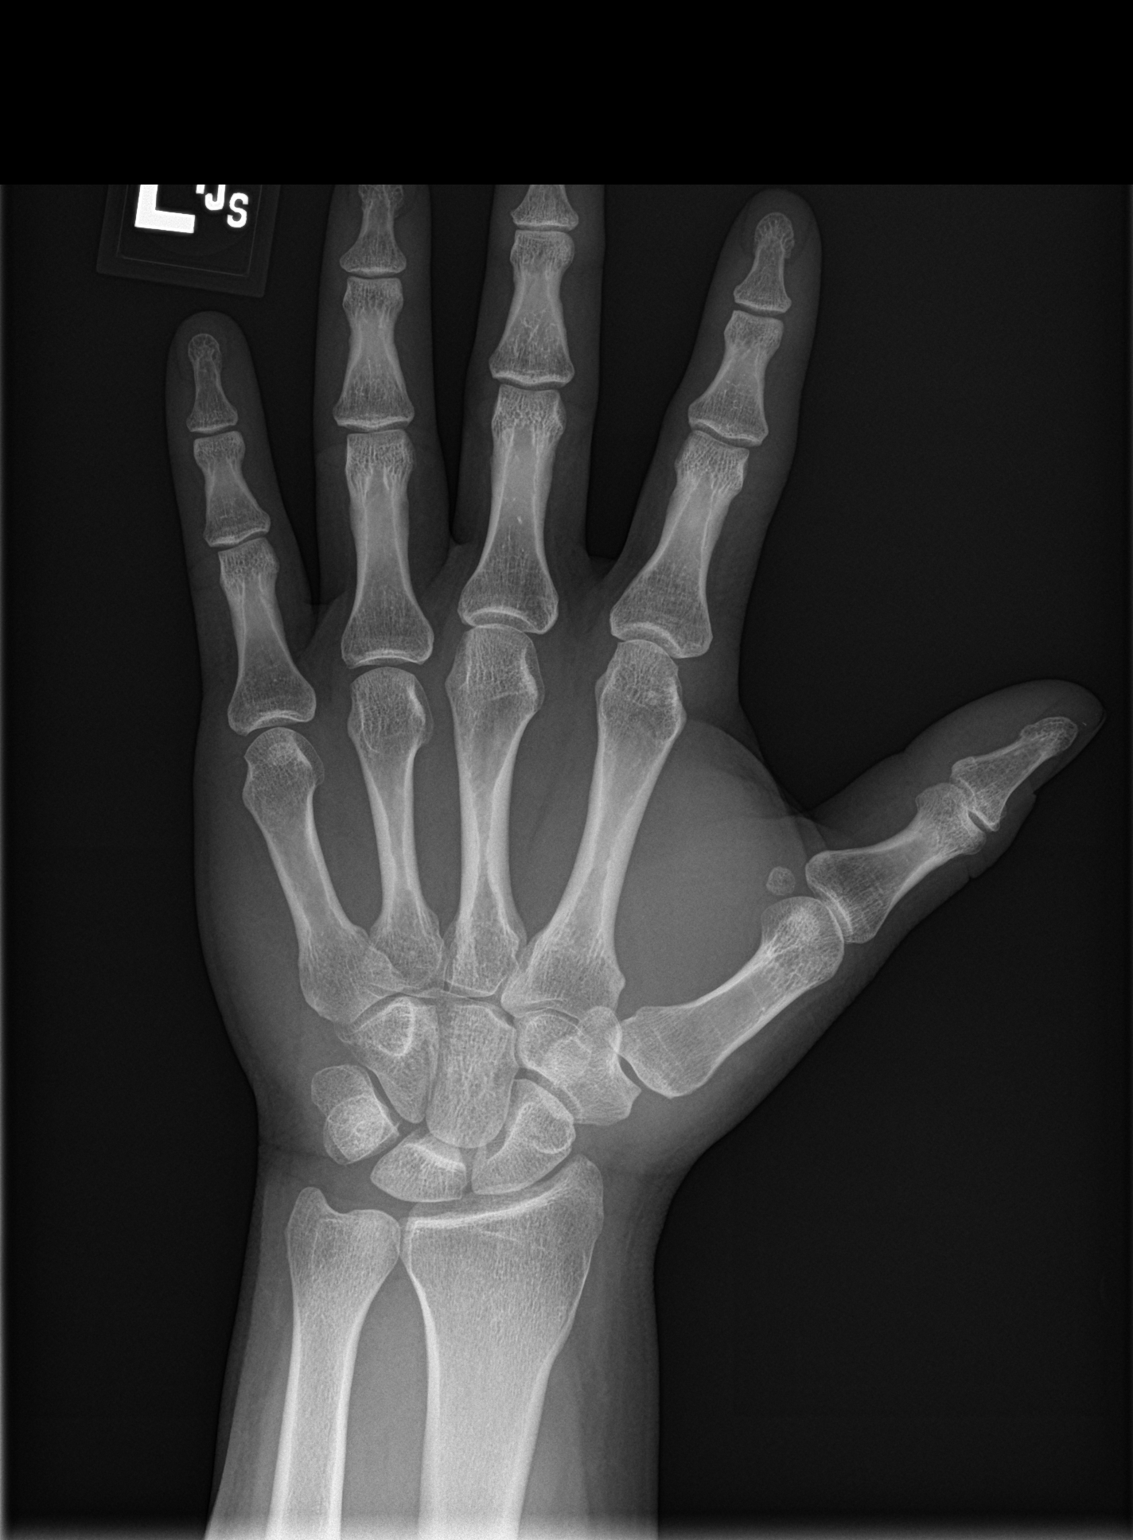
[im 2/3]
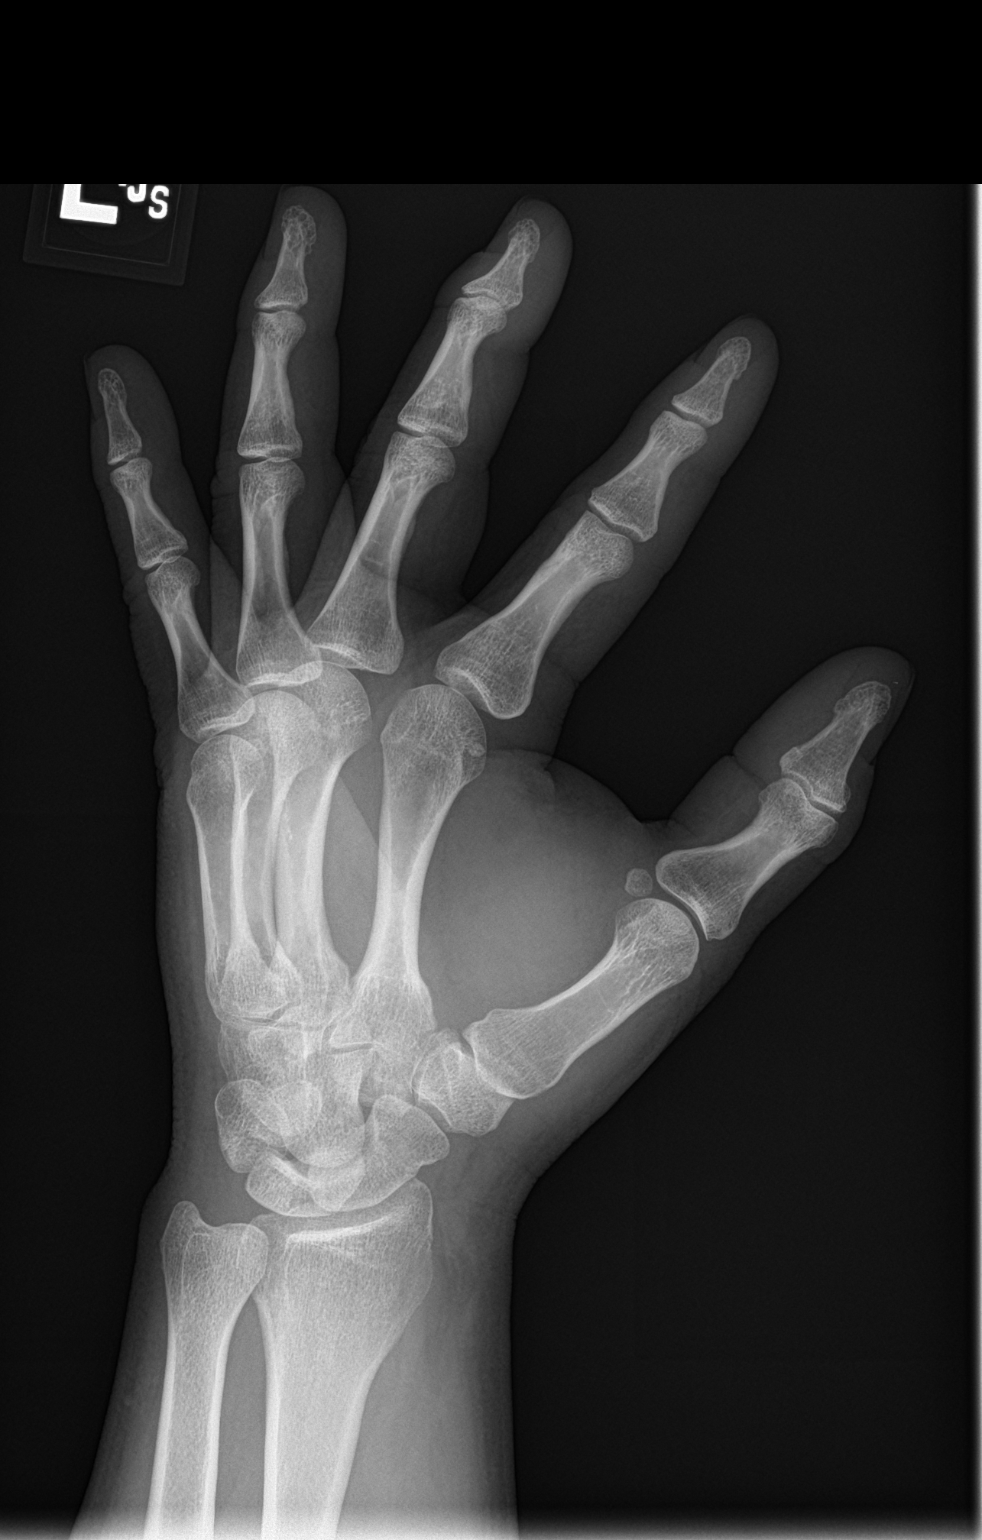
[im 3/3]
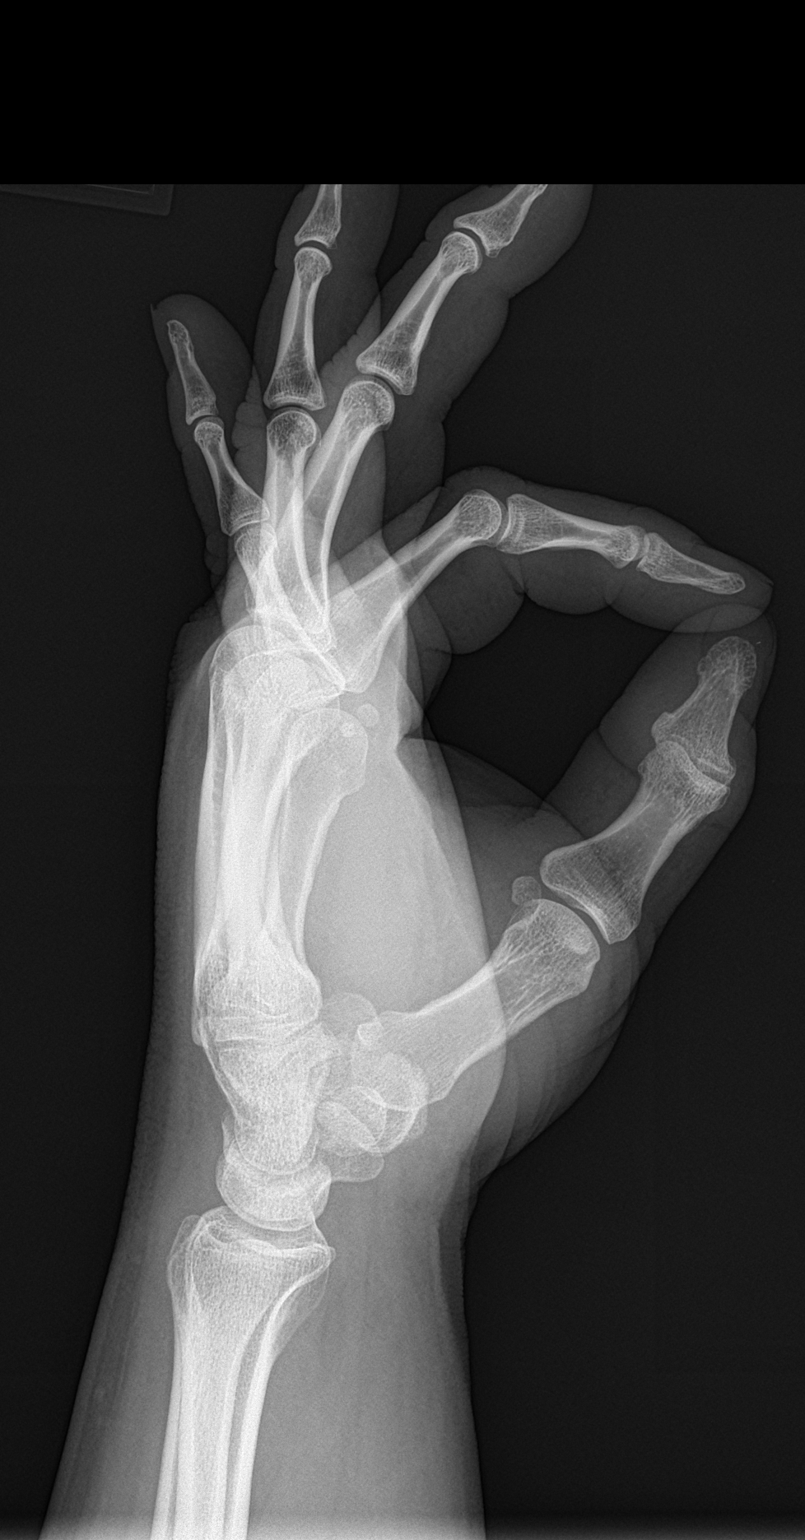

[3 of 3 positions shown; findings below may reference images not displayed]

FINDINGS: No acute soft tissue bony abnormality identified. No evidence of
fracture or dislocation. No significant arthropathy noted. No
radiopaque foreign body.
IMPRESSION: No acute or focal abnormality.

## 2018-01-01 NOTE — Assessment & Plan Note (Signed)
Check lipids today; goal LDL is less than 70; limit saturated fats 

## 2018-01-01 NOTE — Assessment & Plan Note (Signed)
USPSTF grade A and B recommendations reviewed with patient; age-appropriate recommendations, preventive care, screening tests, etc discussed and encouraged; healthy living encouraged; see AVS for patient education given to patient  

## 2018-01-01 NOTE — Assessment & Plan Note (Signed)
Encouragement given; patient politely declined nutritonist or medicine

## 2018-01-01 NOTE — Patient Instructions (Addendum)
We'll get labs today Try to limit saturated fats in your diet (bologna, hot dogs, barbeque, cheeseburgers, hamburgers, steak, bacon, sausage, cheese, etc.) and get more fresh fruits, vegetables, and whole grains   You received the flu shot today; it should protect you against the flu virus over the coming months; it will take about two weeks for antibodies to develop; do try to stay away from hospitals, nursing homes, and daycares during peak flu season; taking extra vitamin C daily during flu season may help you avoid getting sick  If you need something for aches or pains, try to use Tylenol (acetaminophen) instead of non-steroidals (which include Aleve, ibuprofen, Advil, Motrin, and naproxen); non-steroidals can cause long-term kidney damage Always follow package directions   Health Maintenance, Male A healthy lifestyle and preventive care is important for your health and wellness. Ask your health care provider about what schedule of regular examinations is right for you. What should I know about weight and diet? Eat a Healthy Diet  Eat plenty of vegetables, fruits, whole grains, low-fat dairy products, and lean protein.  Do not eat a lot of foods high in solid fats, added sugars, or salt.  Maintain a Healthy Weight Regular exercise can help you achieve or maintain a healthy weight. You should:  Do at least 150 minutes of exercise each week. The exercise should increase your heart rate and make you sweat (moderate-intensity exercise).  Do strength-training exercises at least twice a week.  Watch Your Levels of Cholesterol and Blood Lipids  Have your blood tested for lipids and cholesterol every 5 years starting at 37 years of age. If you are at high risk for heart disease, you should start having your blood tested when you are 37 years old. You may need to have your cholesterol levels checked more often if: ? Your lipid or cholesterol levels are high. ? You are older than 37 years of  age. ? You are at high risk for heart disease.  What should I know about cancer screening? Many types of cancers can be detected early and may often be prevented. Lung Cancer  You should be screened every year for lung cancer if: ? You are a current smoker who has smoked for at least 30 years. ? You are a former smoker who has quit within the past 15 years.  Talk to your health care provider about your screening options, when you should start screening, and how often you should be screened.  Colorectal Cancer  Routine colorectal cancer screening usually begins at 37 years of age and should be repeated every 5-10 years until you are 37 years old. You may need to be screened more often if early forms of precancerous polyps or small growths are found. Your health care provider may recommend screening at an earlier age if you have risk factors for colon cancer.  Your health care provider may recommend using home test kits to check for hidden blood in the stool.  A small camera at the end of a tube can be used to examine your colon (sigmoidoscopy or colonoscopy). This checks for the earliest forms of colorectal cancer.  Prostate and Testicular Cancer  Depending on your age and overall health, your health care provider may do certain tests to screen for prostate and testicular cancer.  Talk to your health care provider about any symptoms or concerns you have about testicular or prostate cancer.  Skin Cancer  Check your skin from head to toe regularly.  Tell your  health care provider about any new moles or changes in moles, especially if: ? There is a change in a mole's size, shape, or color. ? You have a mole that is larger than a pencil eraser.  Always use sunscreen. Apply sunscreen liberally and repeat throughout the day.  Protect yourself by wearing long sleeves, pants, a wide-brimmed hat, and sunglasses when outside.  What should I know about heart disease, diabetes, and high  blood pressure?  If you are 97-39 years of age, have your blood pressure checked every 3-5 years. If you are 37 years of age or older, have your blood pressure checked every year. You should have your blood pressure measured twice-once when you are at a hospital or clinic, and once when you are not at a hospital or clinic. Record the average of the two measurements. To check your blood pressure when you are not at a hospital or clinic, you can use: ? An automated blood pressure machine at a pharmacy. ? A home blood pressure monitor.  Talk to your health care provider about your target blood pressure.  If you are between 86-89 years old, ask your health care provider if you should take aspirin to prevent heart disease.  Have regular diabetes screenings by checking your fasting blood sugar level. ? If you are at a normal weight and have a low risk for diabetes, have this test once every three years after the age of 71. ? If you are overweight and have a high risk for diabetes, consider being tested at a younger age or more often.  A one-time screening for abdominal aortic aneurysm (AAA) by ultrasound is recommended for men aged 65-75 years who are current or former smokers. What should I know about preventing infection? Hepatitis B If you have a higher risk for hepatitis B, you should be screened for this virus. Talk with your health care provider to find out if you are at risk for hepatitis B infection. Hepatitis C Blood testing is recommended for:  Everyone born from [birth year] through 1965.  Anyone with known risk factors for hepatitis C.  Sexually Transmitted Diseases (STDs)  You should be screened each year for STDs including gonorrhea and chlamydia if: ? You are sexually active and are younger than 37 years of age. ? You are older than 37 years of age and your health care provider tells you that you are at risk for this type of infection. ? Your sexual activity has changed since you were  last screened and you are at an increased risk for chlamydia or gonorrhea. Ask your health care provider if you are at risk.  Talk with your health care provider about whether you are at high risk of being infected with HIV. Your health care provider may recommend a prescription medicine to help prevent HIV infection.  What else can I do?  Schedule regular health, dental, and eye exams.  Stay current with your vaccines (immunizations).  Do not use any tobacco products, such as cigarettes, chewing tobacco, and e-cigarettes. If you need help quitting, ask your health care provider.  Limit alcohol intake to no more than 2 drinks per day. One drink equals 12 ounces of beer, 5 ounces of wine, or 1 ounces of hard liquor.  Do not use street drugs.  Do not share needles.  Ask your health care provider for help if you need support or information about quitting drugs.  Tell your health care provider if you often feel depressed.  Tell  your health care provider if you have ever been abused or do not feel safe at home. This information is not intended to replace advice given to you by your health care provider. Make sure you discuss any questions you have with your health care provider. Document Released: 08/12/2007 Document Revised: 10/13/2015 Document Reviewed: 11/17/2014 Elsevier Interactive Patient Education  2018 [employer].  Obesity, Adult Obesity is the condition of having too much total body fat. Being overweight or obese means that your weight is greater than what is considered healthy for your body size. Obesity is determined by a measurement called BMI. BMI is an estimate of body fat and is calculated from height and weight. For adults, a BMI of 30 or higher is considered obese. Obesity can eventually lead to other health concerns and major illnesses, including:  Stroke.  Coronary artery disease (CAD).  Type 2 diabetes.  Some types of cancer, including cancers of the colon,  breast, uterus, and gallbladder.  Osteoarthritis.  High blood pressure (hypertension).  High cholesterol.  Sleep apnea.  Gallbladder stones.  Infertility problems.  What are the causes? The main cause of obesity is taking in (consuming) more calories than your body uses for energy. Other factors that contribute to this condition may include:  Being born with genes that make you more likely to become obese.  Having a medical condition that causes obesity. These conditions include: ? Hypothyroidism. ? Polycystic ovarian syndrome (PCOS). ? Binge-eating disorder. ? Cushing syndrome.  Taking certain medicines, such as steroids, antidepressants, and seizure medicines.  Not being physically active (sedentary lifestyle).  Living where there are limited places to exercise safely or buy healthy foods.  Not getting enough sleep.  What increases the risk? The following factors may increase your risk of this condition:  Having a family history of obesity.  Being a woman of African-American descent.  Being a man of Hispanic descent.  What are the signs or symptoms? Having excessive body fat is the main symptom of this condition. How is this diagnosed? This condition may be diagnosed based on:  Your symptoms.  Your medical history.  A physical exam. Your health care provider may measure: ? Your BMI. If you are an adult with a BMI between 25 and less than 30, you are considered overweight. If you are an adult with a BMI of 30 or higher, you are considered obese. ? The distances around your hips and your waist (circumferences). These may be compared to each other to help diagnose your condition. ? Your skinfold thickness. Your health care provider may gently pinch a fold of your skin and measure it.  How is this treated? Treatment for this condition often includes changing your lifestyle. Treatment may include some or all of the following:  Dietary changes. Work with your  health care provider and a dietitian to set a weight-loss goal that is healthy and reasonable for you. Dietary changes may include eating: ? Smaller portions. A portion size is the amount of a particular food that is healthy for you to eat at one time. This varies from person to person. ? Low-calorie or low-fat options. ? More whole grains, fruits, and vegetables.  Regular physical activity. This may include aerobic activity (cardio) and strength training.  Medicine to help you lose weight. Your health care provider may prescribe medicine if you are unable to lose 1 pound a week after 6 weeks of eating more healthily and doing more physical activity.  Surgery. Surgical options may include  gastric banding and gastric bypass. Surgery may be done if: ? Other treatments have not helped to improve your condition. ? You have a BMI of 40 or higher. ? You have life-threatening health problems related to obesity.  Follow these instructions at home:  Eating and drinking   Follow recommendations from your health care provider about what you eat and drink. Your health care provider may advise you to: ? Limit fast foods, sweets, and processed snack foods. ? Choose low-fat options, such as low-fat milk instead of whole milk. ? Eat 5 or more servings of fruits or vegetables every day. ? Eat at home more often. This gives you more control over what you eat. ? Choose healthy foods when you eat out. ? Learn what a healthy portion size is. ? Keep low-fat snacks on hand. ? Avoid sugary drinks, such as soda, fruit juice, iced tea sweetened with sugar, and flavored milk. ? Eat a healthy breakfast.  Drink enough water to keep your urine clear or pale yellow.  Do not go without eating for long periods of time (do not fast) or follow a fad diet. Fasting and fad diets can be unhealthy and even dangerous. Physical Activity  Exercise regularly, as told by your health care provider. Ask your health care  provider what types of exercise are safe for you and how often you should exercise.  Warm up and stretch before being active.  Cool down and stretch after being active.  Rest between periods of activity. Lifestyle  Limit the time that you spend in front of your TV, computer, or video game system.  Find ways to reward yourself that do not involve food.  Limit alcohol intake to no more than 1 drink a day for nonpregnant women and 2 drinks a day for men. One drink equals 12 oz of beer, 5 oz of wine, or 1 oz of hard liquor. General instructions  Keep a weight loss journal to keep track of the food you eat and how much you exercise you get.  Take over-the-counter and prescription medicines only as told by your health care provider.  Take vitamins and supplements only as told by your health care provider.  Consider joining a support group. Your health care provider may be able to recommend a support group.  Keep all follow-up visits as told by your health care provider. This is important. Contact a health care provider if:  You are unable to meet your weight loss goal after 6 weeks of dietary and lifestyle changes. This information is not intended to replace advice given to you by your health care provider. Make sure you discuss any questions you have with your health care provider. Document Released: 03/23/2004 Document Revised: 07/19/2015 Document Reviewed: 12/02/2014 Elsevier Interactive Patient Education  2018 [employer]  Preventing Unhealthy [employer], Adult Staying at a healthy weight is important. When fat builds up in your body, you may become overweight or obese. These conditions put you at greater risk for developing certain health problems, such as heart disease, diabetes, sleeping problems, joint problems, and some cancers. Unhealthy weight gain is often the result of making unhealthy choices in what you eat. It is also a result of not getting enough exercise. You can  make changes to your lifestyle to prevent obesity and stay as healthy as possible. What nutrition changes can be made? To maintain a healthy weight and prevent obesity:  Eat only as much as your body needs. To do this: ? Pay attention  to signs that you are hungry or full. Stop eating as soon as you feel full. ? If you feel hungry, try drinking water first. Drink enough water so your urine is clear or pale yellow. ? Eat smaller portions. ? Look at serving sizes on food labels. Most foods contain more than one serving per container. ? Eat the recommended amount of calories for your gender and activity level. While most active people should eat around 2,000 calories per day, if you are trying to lose weight or are not very active, you main need to eat less calories. Talk to your health care provider or dietitian about how many calories you should eat each day.  Choose healthy foods, such as: ? Fruits and vegetables. Try to fill at least half of your plate at each meal with fruits and vegetables. ? Whole grains, such as whole wheat bread, brown rice, and quinoa. ? Lean meats, such as chicken or fish. ? Other healthy proteins, such as beans, eggs, or tofu. ? Healthy fats, such as nuts, seeds, fatty fish, and olive oil. ? Low-fat or fat-free dairy.  Check food labels and avoid food and drinks that: ? Are high in calories. ? Have added sugar. ? Are high in sodium. ? Have saturated fats or trans fats.  Limit how much you eat of the following foods: ? Prepackaged meals. ? Fast food. ? Fried foods. ? Processed meat, such as bacon, sausage, and deli meats. ? Fatty cuts of red meat and poultry with skin.  Cook foods in healthier ways, such as by baking, broiling, or grilling.  When grocery shopping, try to shop around the outside of the store. This helps you buy mostly fresh foods and avoid canned and prepackaged foods.  What lifestyle changes can be made?  Exercise at least 30 minutes 5 or  more days each week. Exercising includes brisk walking, yard work, biking, running, swimming, and team sports like basketball and soccer. Ask your health care provider which exercises are safe for you.  Do not use any products that contain nicotine or tobacco, such as cigarettes and e-cigarettes. If you need help quitting, ask your health care provider.  Limit alcohol intake to no more than 1 drink a day for nonpregnant women and 2 drinks a day for men. One drink equals 12 oz of beer, 5 oz of wine, or 1 oz of hard liquor.  Try to get 7-9 hours of sleep each night. What other changes can be made?  Keep a food and activity journal to keep track of: ? What you ate and how many calories you had. Remember to count sauces, dressings, and side dishes. ? Whether you were active, and what exercises you did. ? Your calorie, weight, and activity goals.  Check your weight regularly. Track any changes. If you notice you have gained weight, make changes to your diet or activity routine.  Avoid taking weight-loss medicines or supplements. Talk to your health care provider before starting any new medicine or supplement.  Talk to your health care provider before trying any new diet or exercise plan. Why are these changes important? Eating healthy, staying active, and having healthy habits not only help prevent obesity, they also:  Help you to manage stress and emotions.  Help you to connect with friends and family.  Improve your self-esteem.  Improve your sleep.  Prevent long-term health problems.  What can happen if changes are not made? Being obese or overweight can cause you to develop joint  or bone problems, which can make it hard for you to stay active or do activities you enjoy. Being obese or overweight also puts stress on your heart and lungs and can lead to health problems like diabetes, heart disease, and some cancers. Where to find more information: Talk with your health care provider  or a dietitian about healthy eating and healthy lifestyle choices. You may also find other information through these resources:  [place] Department of Agriculture MyPlate: https://ball-collins.biz/  American Heart Association: www.heart.org  Centers for Disease Control and Prevention: FootballExhibition.com.br  Summary  Staying at a healthy weight is important. It helps prevent certain diseases and health problems, such as heart disease, diabetes, joint problems, sleep disorders, and some cancers.  Being obese or overweight can cause you to develop joint or bone problems, which can make it hard for you to stay active or do activities you enjoy.  You can prevent unhealthy weight gain by eating a healthy diet, exercising regularly, not smoking, limiting alcohol, and getting enough sleep.  Talk with your health care provider or a dietitian for guidance about healthy eating and healthy lifestyle choices. This information is not intended to replace advice given to you by your health care provider. Make sure you discuss any questions you have with your health care provider. Document Released: 02/15/2016 Document Revised: 03/22/2016 Document Reviewed: 03/22/2016 Elsevier Interactive Patient Education  [employer].

## 2018-01-01 NOTE — Progress Notes (Signed)
BP 118/74   Pulse 91   Temp 97.7 F (36.5 C) (Oral)   Wt 273 lb 14.4 oz (124.2 kg)   SpO2 97%   BMI 44.21 kg/m    Subjective:    Patient ID: Austin Fuentes, male    DOB: 26-Feb-1981, 37 y.o.   MRN: [id number]  HPI: Austin Fuentes is a 37 y.o. male  Chief Complaint  Patient presents with  . Annual Exam    HPI Patient is here for a complete physical but has several other issues to discuss  He is having pain in his hands; has to wear a brace when bowling and at work, left wrist; hurts to get up in the morning;  Has pains that shoot across his hands; going on for a while; was referred to orthopaedist and shot did nothing No fam hx of rheumatoid arthritis Having some lower back pain and hurts to get up in the mornings; takes him about an hour or more to get going; can get going if at work and more active Taking ibuprofen or aleve (4 at a time, immediately cautioned) Has psoriasis  High cholesterol, missing some doses; room for improvement with diet  Prediabetes; no dry mouth or blurred vision; no known family hx of diabetes  Obesity; down to 259 pounds at one point; stress made the weight came back on; stress eater at times  USPSTF grade A and B recommendations Depression:  Depression screen [facility] 2/9 01/01/2018 09/05/2016 03/06/2016 11/29/2015 07/20/2015  Decreased Interest 0 0 0 0 0  Down, Depressed, Hopeless 0 0 0 0 0  PHQ - 2 Score 0 0 0 0 0  Altered sleeping 0 - - - -  Tired, decreased energy 0 - - - -  Change in appetite 0 - - - -  Feeling bad or failure about yourself  0 - - - -  Trouble concentrating 0 - - - -  Moving slowly or fidgety/restless 0 - - - -  Suicidal thoughts 0 - - - -  PHQ-9 Score 0 - - - -  Difficult doing work/chores Not difficult at all - - - -   Hypertension: BP Readings from Last 3 Encounters:  01/01/18 118/74  09/05/16 118/78  03/06/16 118/82   Obesity: Wt Readings from Last 3 Encounters:  01/01/18 273 lb 14.4 oz (124.2 kg)  09/05/16 283  lb (128.4 kg)  03/06/16 272 lb 4 oz (123.5 kg)   BMI Readings from Last 3 Encounters:  01/01/18 44.21 kg/m  09/05/16 45.68 kg/m  03/06/16 43.94 kg/m    Immunizations: flu shot today  Skin cancer: one bump on medial right leg, unchanged for years; no other worrisome moles Lung cancer:  nonsmoker Prostate cancer: at age 10 No results found for: PSA Colorectal cancer: at age 39 or 70 based on guidelines at that time AAA: n/a Aspirin: taking 325 mg daily  Diet: room for improvement Exercise: bowling (but it hurts); not much walking; puts in 14k to 16k steps at work Alcohol:    [facility] Visit from 01/01/2018 in [facility]  AUDIT-C Score  0      Tobacco use: non HIV, hep B, hep C: not intersted STD testing and prevention (chl/gon/syphilis): not interested Glucose:  Glucose, Bld  Date Value Ref Range Status  01/01/2018 88 65 - 99 mg/dL Final    Comment:    .            Fasting reference interval .  10/26/2016 96 65 - 99 mg/dL Final  16/11/9602 94 65 - 99 mg/dL Final   Glucose-Capillary  Date Value Ref Range Status  06/01/2015 126 (H) 65 - 99 mg/dL Final  54/10/8117 86 65 - 99 mg/dL Final  14/78/2956 213 (H) 65 - 99 mg/dL Final   Lipids:  Lab Results  Component Value Date   CHOL 177 01/01/2018   CHOL 136 10/26/2016   CHOL 158 03/06/2016   Lab Results  Component Value Date   HDL 35 (L) 01/01/2018   HDL 36 (L) 10/26/2016   HDL 32 (L) 03/06/2016   Lab Results  Component Value Date   LDLCALC 108 (H) 01/01/2018   LDLCALC 82 10/26/2016   LDLCALC 105 (H) 03/06/2016   Lab Results  Component Value Date   TRIG 227 (H) 01/01/2018   TRIG 88 10/26/2016   TRIG 104 03/06/2016   Lab Results  Component Value Date   CHOLHDL 5.1 (H) 01/01/2018   CHOLHDL 3.8 10/26/2016   CHOLHDL 4.9 03/06/2016   No results found for: LDLDIRECT   Depression screen [facility] 2/9 01/01/2018 09/05/2016 03/06/2016 11/29/2015 07/20/2015  Decreased Interest 0 0 0 0 0  Down,  Depressed, Hopeless 0 0 0 0 0  PHQ - 2 Score 0 0 0 0 0  Altered sleeping 0 - - - -  Tired, decreased energy 0 - - - -  Change in appetite 0 - - - -  Feeling bad or failure about yourself  0 - - - -  Trouble concentrating 0 - - - -  Moving slowly or fidgety/restless 0 - - - -  Suicidal thoughts 0 - - - -  PHQ-9 Score 0 - - - -  Difficult doing work/chores Not difficult at all - - - -   Fall Risk  01/01/2018 09/05/2016 03/06/2016 11/29/2015 07/20/2015  Falls in the past year? 0 No No Yes Yes  Number falls in past yr: 0 - - 1 1  Injury with Fall? - - - No No  Follow up - - - - Falls evaluation completed   Relevant past medical, surgical, family and social history reviewed Past Medical History:  Diagnosis Date  . Allergy   . OSA (obstructive sleep apnea) 07/20/2015   To wear CPAP  . Psoriasis 07/20/2015  . Stroke [facility])    Past Surgical History:  Procedure Laterality Date  . EP IMPLANTABLE DEVICE N/A 05/31/2015   Procedure: Loop Recorder Insertion;  Surgeon: Thurmon Fair, MD;  Location: [facility] INVASIVE CV LAB;  Service: Cardiovascular;  Laterality: N/A;  . TEE WITHOUT CARDIOVERSION N/A 05/31/2015   Procedure: TRANSESOPHAGEAL ECHOCARDIOGRAM (TEE);  Surgeon: Thurmon Fair, MD;  Location: [facility] ENDOSCOPY;  Service: Cardiovascular;  Laterality: N/A;  . TONSILLECTOMY     Family History  Problem Relation Age of Onset  . Cancer Paternal Grandmother        breast/lung  . Stroke Paternal Grandfather    Social History   Tobacco Use  . Smoking status: Never Smoker  . Smokeless tobacco: Never Used  Substance Use Topics  . Alcohol use: No    Alcohol/week: 0.0 standard drinks  . Drug use: No     [facility] Visit from 01/01/2018 in [facility]  AUDIT-C Score  0      Interim medical history since last visit reviewed. Allergies and medications reviewed  Review of Systems Per HPI unless specifically indicated above     Objective:    BP 118/74   Pulse 91  Temp 97.7 F (36.5  C) (Oral)   Wt 273 lb 14.4 oz (124.2 kg)   SpO2 97%   BMI 44.21 kg/m   Wt Readings from Last 3 Encounters:  01/01/18 273 lb 14.4 oz (124.2 kg)  09/05/16 283 lb (128.4 kg)  03/06/16 272 lb 4 oz (123.5 kg)    Physical Exam  Constitutional: He appears well-developed and well-nourished. No distress.  Morbidly obese, but weight down more than 9 pounds since last documented weight here over a year ago  HENT:  Head: Normocephalic and atraumatic.  Nose: Nose normal.  Mouth/Throat: Oropharynx is clear and moist.  Eyes: EOM are normal. No scleral icterus.  Neck: No JVD present. No thyromegaly present.  Cardiovascular: Normal rate, regular rhythm and normal heart sounds.  Pulmonary/Chest: Effort normal and breath sounds normal. No respiratory distress. He has no wheezes. He has no rales.  Abdominal: Soft. Bowel sounds are normal. He exhibits no distension. There is no tenderness. There is no guarding.  Musculoskeletal: Normal range of motion. He exhibits no edema.       Right hand: He exhibits tenderness. He exhibits no deformity.       Left hand: He exhibits tenderness. He exhibits no deformity.  Lymphadenopathy:    He has no cervical adenopathy.  Neurological: He is alert. He displays normal reflexes. He exhibits normal muscle tone. Coordination normal.  Skin: Skin is warm and dry. Rash (erythematous plaque with scale over extensor surfaces of elbow and knees) noted. He is not diaphoretic. No erythema. No pallor.  Psychiatric: He has a normal mood and affect. His behavior is normal. Judgment and thought content normal.      Assessment & Plan:   Problem List Items Addressed This Visit      Musculoskeletal and Integument   Psoriasis   Relevant Orders   Ambulatory referral to Rheumatology     Other   Preventative health care - Primary    USPSTF grade A and B recommendations reviewed with patient; age-appropriate recommendations, preventive care, screening tests, etc discussed and  encouraged; healthy living encouraged; see AVS for patient education given to patient       Relevant Orders   CBC with Differential/Platelet (Completed)   COMPLETE METABOLIC PANEL WITH GFR (Completed)   Lipid panel (Completed)   TSH (Completed)   Prediabetes    Check glucose and A1c (truly fasting)      Relevant Orders   Hemoglobin A1c   Morbid obesity (HCC)    Encouragement given; patient politely declined nutritonist or medicine      Hyperlipidemia    Check lipids today; goal LDL is less than 70; limit saturated fats       Other Visit Diagnoses    Need for influenza vaccination       Relevant Orders   Flu Vaccine QUAD 6+ mos PF IM (Fluarix Quad PF) (Completed)   Arthralgia of multiple joints       Relevant Orders   Ambulatory referral to Rheumatology   VITAMIN D 25 Hydroxy (Vit-D Deficiency, Fractures)   C-reactive protein   DG Hand Complete Left (Completed)   DG Hand Complete Right (Completed)       Follow up plan: Return in about 1 year (around 01/02/2019) for complete physical; 6 months for chronic health issues.  An after-visit summary was printed and given to the patient at check-out.  Please see the patient instructions which may contain other information and recommendations beyond what is mentioned above in the assessment and  plan.  No orders of the defined types were placed in this encounter.   Orders Placed This Encounter  Procedures  . DG Hand Complete Left  . DG Hand Complete Right  . Flu Vaccine QUAD 6+ mos PF IM (Fluarix Quad PF)  . CBC with Differential/Platelet  . COMPLETE METABOLIC PANEL WITH GFR  . Lipid panel  . TSH  . VITAMIN D 25 Hydroxy (Vit-D Deficiency, Fractures)  . C-reactive protein  . Hemoglobin A1c  . Hemoglobin A1c  . VITAMIN D 25 Hydroxy (Vit-D Deficiency, Fractures)  . C-reactive protein  . Ambulatory referral to Rheumatology

## 2018-01-01 NOTE — Assessment & Plan Note (Signed)
Check glucose and A1c (truly fasting) 

## 2018-01-03 ENCOUNTER — Other Ambulatory Visit: Payer: Self-pay | Admitting: Family Medicine

## 2018-01-03 DIAGNOSIS — R718 Other abnormality of red blood cells: Secondary | ICD-10-CM

## 2018-01-03 MED ORDER — ATORVASTATIN CALCIUM 40 MG PO TABS: 40.0000 mg | ORAL_TABLET | Freq: Every day | ORAL | 0 refills | Status: DC

## 2018-01-03 MED ORDER — VITAMIN D (ERGOCALCIFEROL) 1.25 MG (50000 UNIT) PO CAPS: 50000.0000 [IU] | ORAL_CAPSULE | ORAL | 1 refills | Status: AC

## 2018-01-03 NOTE — Progress Notes (Signed)
Add on iron studies; patient to do stool cards

## 2018-01-04 ENCOUNTER — Other Ambulatory Visit: Payer: Self-pay

## 2018-01-04 DIAGNOSIS — R718 Other abnormality of red blood cells: Secondary | ICD-10-CM

## 2018-01-08 ENCOUNTER — Telehealth: Payer: Self-pay

## 2018-01-08 DIAGNOSIS — D649 Anemia, unspecified: Secondary | ICD-10-CM

## 2018-01-08 LAB — CBC WITH DIFFERENTIAL/PLATELET
BASOS ABS: 32 {cells}/uL (ref 0–200)
Basophils Relative: 0.4 %
EOS ABS: 288 {cells}/uL (ref 15–500)
Eosinophils Relative: 3.6 %
HEMATOCRIT: 41.1 % (ref 38.5–50.0)
HEMOGLOBIN: 13.1 g/dL — AB (ref 13.2–17.1)
Lymphs Abs: 2272 cells/uL (ref 850–3900)
MCH: 24.7 pg — AB (ref 27.0–33.0)
MCHC: 31.9 g/dL — AB (ref 32.0–36.0)
MCV: 77.5 fL — AB (ref 80.0–100.0)
MONOS PCT: 6.8 %
MPV: 10 fL (ref 7.5–12.5)
NEUTROS ABS: 4864 {cells}/uL (ref 1500–7800)
Neutrophils Relative %: 60.8 %
Platelets: 333 10*3/uL (ref 140–400)
RBC: 5.3 10*6/uL (ref 4.20–5.80)
RDW: 13.6 % (ref 11.0–15.0)
Total Lymphocyte: 28.4 %
WBC mixed population: 544 cells/uL (ref 200–950)
WBC: 8 10*3/uL (ref 3.8–10.8)

## 2018-01-08 LAB — HEMOGLOBIN A1C
HEMOGLOBIN A1C: 5.6 %{Hb} (ref <5.7)
Mean Plasma Glucose: 114 (calc)
eAG (mmol/L): 6.3 (calc)

## 2018-01-08 LAB — COMPLETE METABOLIC PANEL WITH GFR
AG Ratio: 1.4 (calc) (ref 1.0–2.5)
ALBUMIN MSPROF: 3.8 g/dL (ref 3.6–5.1)
ALKALINE PHOSPHATASE (APISO): 72 U/L (ref 40–115)
ALT: 17 U/L (ref 9–46)
AST: 15 U/L (ref 10–40)
BUN: 12 mg/dL (ref 7–25)
CALCIUM: 9 mg/dL (ref 8.6–10.3)
CO2: 29 mmol/L (ref 20–32)
CREATININE: 0.77 mg/dL (ref 0.60–1.35)
Chloride: 105 mmol/L (ref 98–110)
GFR, EST AFRICAN AMERICAN: 134 mL/min/{1.73_m2} (ref >=60)
GFR, Est Non African American: 116 mL/min/{1.73_m2} (ref >=60)
GLUCOSE: 88 mg/dL (ref 65–99)
Globulin: 2.8 g/dL (calc) (ref 1.9–3.7)
Potassium: 4.3 mmol/L (ref 3.5–5.3)
Sodium: 138 mmol/L (ref 135–146)
TOTAL PROTEIN: 6.6 g/dL (ref 6.1–8.1)
Total Bilirubin: 0.2 mg/dL (ref 0.2–1.2)

## 2018-01-08 LAB — IRON,TIBC AND FERRITIN PANEL
%SAT: 7 % (calc) — ABNORMAL LOW (ref 20–48)
Ferritin: 29 ng/mL — ABNORMAL LOW (ref 38–380)
Iron: 25 ug/dL — ABNORMAL LOW (ref 50–180)
TIBC: 355 mcg/dL (calc) (ref 250–425)

## 2018-01-08 LAB — C-REACTIVE PROTEIN: CRP: 6.4 mg/L (ref <8.0)

## 2018-01-08 LAB — LIPID PANEL
CHOL/HDL RATIO: 5.1 (calc) — AB (ref <5.0)
CHOLESTEROL: 177 mg/dL (ref <200)
HDL: 35 mg/dL — ABNORMAL LOW (ref >40)
LDL CHOLESTEROL (CALC): 108 mg/dL — AB
Non-HDL Cholesterol (Calc): 142 mg/dL (calc) — ABNORMAL HIGH (ref <130)
Triglycerides: 227 mg/dL — ABNORMAL HIGH (ref <150)

## 2018-01-08 LAB — TEST AUTHORIZATION

## 2018-01-08 LAB — TSH: TSH: 1.85 mIU/L (ref 0.40–4.50)

## 2018-01-08 LAB — VITAMIN D 25 HYDROXY (VIT D DEFICIENCY, FRACTURES): VIT D 25 HYDROXY: 16 ng/mL — AB (ref 30–100)

## 2018-01-08 NOTE — Telephone Encounter (Signed)
-----   Message from Marcos Eke, CMA sent at 01/08/2018 11:27 AM EST -----   ----- Message ----- From: Kerman Passey, MD Sent: 01/08/2018  10:21 AM EST To: Marcos Eke, CMA  Terita Hejl, please let patient know that his iron is low. Ask him to do the stool cards ASAP. Start iron therapy (324 or 325 mg ferrous sufate once a day) for 2 months, and recheck CBC and iron studies in 2 months (please ORDER, dx anemia). Have him come in as well for a urine dip to check for blood in the urine (dx: anemia); thank you

## 2018-01-29 DIAGNOSIS — M2550 Pain in unspecified joint: Secondary | ICD-10-CM | POA: Diagnosis not present

## 2018-01-29 DIAGNOSIS — L409 Psoriasis, unspecified: Secondary | ICD-10-CM | POA: Diagnosis not present

## 2018-01-29 DIAGNOSIS — M545 Low back pain: Secondary | ICD-10-CM | POA: Diagnosis not present

## 2018-01-29 DIAGNOSIS — Z79899 Other long term (current) drug therapy: Secondary | ICD-10-CM | POA: Diagnosis not present

## 2018-01-29 DIAGNOSIS — M48061 Spinal stenosis, lumbar region without neurogenic claudication: Secondary | ICD-10-CM | POA: Diagnosis not present

## 2018-01-29 DIAGNOSIS — M1990 Unspecified osteoarthritis, unspecified site: Secondary | ICD-10-CM | POA: Diagnosis not present

## 2018-02-11 DIAGNOSIS — L4050 Arthropathic psoriasis, unspecified: Secondary | ICD-10-CM | POA: Diagnosis not present

## 2018-02-11 DIAGNOSIS — L409 Psoriasis, unspecified: Secondary | ICD-10-CM | POA: Diagnosis not present

## 2018-02-11 DIAGNOSIS — M47816 Spondylosis without myelopathy or radiculopathy, lumbar region: Secondary | ICD-10-CM | POA: Diagnosis not present

## 2018-02-11 DIAGNOSIS — Z79899 Other long term (current) drug therapy: Secondary | ICD-10-CM | POA: Diagnosis not present

## 2018-02-28 ENCOUNTER — Other Ambulatory Visit: Payer: Self-pay | Admitting: Family Medicine

## 2018-03-07 ENCOUNTER — Other Ambulatory Visit: Payer: Self-pay | Admitting: Family Medicine

## 2018-03-14 DIAGNOSIS — L4050 Arthropathic psoriasis, unspecified: Secondary | ICD-10-CM | POA: Diagnosis not present

## 2018-03-14 DIAGNOSIS — Z79899 Other long term (current) drug therapy: Secondary | ICD-10-CM | POA: Diagnosis not present

## 2018-03-14 DIAGNOSIS — L409 Psoriasis, unspecified: Secondary | ICD-10-CM | POA: Diagnosis not present

## 2018-03-16 ENCOUNTER — Other Ambulatory Visit: Payer: Self-pay | Admitting: Family Medicine

## 2018-03-18 NOTE — Telephone Encounter (Signed)
Please remind patient that he is overdue for labs. Thank you

## 2018-03-18 NOTE — Telephone Encounter (Signed)
Left detailed voicemial 

## 2018-04-15 DIAGNOSIS — L4050 Arthropathic psoriasis, unspecified: Secondary | ICD-10-CM | POA: Diagnosis not present

## 2018-04-15 DIAGNOSIS — M47816 Spondylosis without myelopathy or radiculopathy, lumbar region: Secondary | ICD-10-CM | POA: Diagnosis not present

## 2018-04-15 DIAGNOSIS — L409 Psoriasis, unspecified: Secondary | ICD-10-CM | POA: Diagnosis not present

## 2018-04-15 DIAGNOSIS — Z79899 Other long term (current) drug therapy: Secondary | ICD-10-CM | POA: Diagnosis not present

## 2018-05-30 ENCOUNTER — Other Ambulatory Visit: Payer: Self-pay | Admitting: Family Medicine

## 2018-05-30 NOTE — Telephone Encounter (Signed)
Patient notified

## 2018-05-30 NOTE — Telephone Encounter (Signed)
Please see last notes regarding iron/CBC, etc  "Notes recorded by Kerman Passey, MD on 01/08/2018 at 10:21 AM EST Austin Fuentes, please let patient know that his iron is low. Ask him to do the stool cards ASAP. Start iron therapy (324 or 325 mg ferrous sufate once a day) for 2 months, and recheck CBC and iron studies in 2 months (please ORDER, dx anemia). Have him come in as well for a urine dip to check for blood in the urine (dx: anemia); thank you"  He is overdue for these; please call him; thank you

## 2018-06-10 DIAGNOSIS — L4050 Arthropathic psoriasis, unspecified: Secondary | ICD-10-CM | POA: Diagnosis not present

## 2018-06-10 DIAGNOSIS — Z79899 Other long term (current) drug therapy: Secondary | ICD-10-CM | POA: Diagnosis not present

## 2018-08-08 DIAGNOSIS — L409 Psoriasis, unspecified: Secondary | ICD-10-CM | POA: Diagnosis not present

## 2018-08-08 DIAGNOSIS — Z79899 Other long term (current) drug therapy: Secondary | ICD-10-CM | POA: Diagnosis not present

## 2018-08-08 DIAGNOSIS — L4050 Arthropathic psoriasis, unspecified: Secondary | ICD-10-CM | POA: Diagnosis not present

## 2018-08-23 ENCOUNTER — Other Ambulatory Visit: Payer: Self-pay | Admitting: Family Medicine

## 2018-08-25 NOTE — Telephone Encounter (Signed)
Patient needs routine appointment. & labs after- within month

## 2018-08-26 NOTE — Telephone Encounter (Signed)
lvm asking pt to schedule appt within a month and also his prescription has been sent to his pharmacy

## 2018-09-13 ENCOUNTER — Emergency Department
Admission: EM | Admit: 2018-09-13 | Discharge: 2018-09-13 | Disposition: A | Discharge status: Home / Self Care | Payer: BC Managed Care – PPO | Attending: Emergency Medicine | Admitting: Emergency Medicine

## 2018-09-13 ENCOUNTER — Emergency Department: Payer: BC Managed Care – PPO

## 2018-09-13 ENCOUNTER — Encounter: Payer: Self-pay | Admitting: *Deleted

## 2018-09-13 ENCOUNTER — Other Ambulatory Visit: Payer: Self-pay

## 2018-09-13 DIAGNOSIS — Z7982 Long term (current) use of aspirin: Secondary | ICD-10-CM | POA: Insufficient documentation

## 2018-09-13 DIAGNOSIS — Z79899 Other long term (current) drug therapy: Secondary | ICD-10-CM | POA: Diagnosis not present

## 2018-09-13 DIAGNOSIS — R319 Hematuria, unspecified: Secondary | ICD-10-CM | POA: Diagnosis not present

## 2018-09-13 DIAGNOSIS — Z8673 Personal history of transient ischemic attack (TIA), and cerebral infarction without residual deficits: Secondary | ICD-10-CM | POA: Insufficient documentation

## 2018-09-13 DIAGNOSIS — R109 Unspecified abdominal pain: Secondary | ICD-10-CM

## 2018-09-13 DIAGNOSIS — N200 Calculus of kidney: Secondary | ICD-10-CM | POA: Insufficient documentation

## 2018-09-13 DIAGNOSIS — K4020 Bilateral inguinal hernia, without obstruction or gangrene, not specified as recurrent: Secondary | ICD-10-CM | POA: Diagnosis not present

## 2018-09-13 DIAGNOSIS — R110 Nausea: Secondary | ICD-10-CM | POA: Diagnosis not present

## 2018-09-13 LAB — CBC WITH DIFFERENTIAL/PLATELET
Abs Immature Granulocytes: 0.05 10*3/uL (ref 0.00–0.07)
Basophils Absolute: 0.1 10*3/uL (ref 0.0–0.1)
Basophils Relative: 0 %
Eosinophils Absolute: 0.2 10*3/uL (ref 0.0–0.5)
Eosinophils Relative: 1 %
HCT: 43.7 % (ref 39.0–52.0)
Hemoglobin: 14 g/dL (ref 13.0–17.0)
Immature Granulocytes: 0 %
Lymphocytes Relative: 26 %
Lymphs Abs: 3.3 10*3/uL (ref 0.7–4.0)
MCH: 26.4 pg (ref 26.0–34.0)
MCHC: 32 g/dL (ref 30.0–36.0)
MCV: 82.5 fL (ref 80.0–100.0)
Monocytes Absolute: 0.7 10*3/uL (ref 0.1–1.0)
Monocytes Relative: 6 %
Neutro Abs: 8.2 10*3/uL — ABNORMAL HIGH (ref 1.7–7.7)
Neutrophils Relative %: 67 %
Platelets: 320 10*3/uL (ref 150–400)
RBC: 5.3 MIL/uL (ref 4.22–5.81)
RDW: 14.1 % (ref 11.5–15.5)
WBC: 12.5 10*3/uL — ABNORMAL HIGH (ref 4.0–10.5)
nRBC: 0 % (ref 0.0–0.2)

## 2018-09-13 LAB — COMPREHENSIVE METABOLIC PANEL
ALT: 29 U/L (ref 0–44)
AST: 25 U/L (ref 15–41)
Albumin: 4.6 g/dL (ref 3.5–5.0)
Alkaline Phosphatase: 59 U/L (ref 38–126)
Anion gap: 9 (ref 5–15)
BUN: 14 mg/dL (ref 6–20)
CO2: 26 mmol/L (ref 22–32)
Calcium: 9 mg/dL (ref 8.9–10.3)
Chloride: 104 mmol/L (ref 98–111)
Creatinine, Ser: 1.14 mg/dL (ref 0.61–1.24)
GFR calc Af Amer: >60 mL/min (ref >60)
GFR calc non Af Amer: >60 mL/min (ref >60)
Glucose, Bld: 108 mg/dL — ABNORMAL HIGH (ref 70–99)
Potassium: 3.8 mmol/L (ref 3.5–5.1)
Sodium: 139 mmol/L (ref 135–145)
Total Bilirubin: 1.1 mg/dL (ref 0.3–1.2)
Total Protein: 7.8 g/dL (ref 6.5–8.1)

## 2018-09-13 LAB — URINALYSIS, COMPLETE (UACMP) WITH MICROSCOPIC
Bacteria, UA: NONE SEEN
Bilirubin Urine: NEGATIVE
Glucose, UA: NEGATIVE mg/dL
Ketones, ur: 5 mg/dL — AB
Leukocytes,Ua: NEGATIVE
Nitrite: NEGATIVE
Protein, ur: 30 mg/dL — AB
RBC / HPF: >50 RBC/hpf — ABNORMAL HIGH (ref 0–5)
Specific Gravity, Urine: 1.028 (ref 1.005–1.030)
pH: 7 (ref 5.0–8.0)

## 2018-09-13 MED ORDER — MORPHINE SULFATE (PF) 4 MG/ML IV SOLN
4.0000 mg | Freq: Once | INTRAVENOUS | Status: AC
  Administered 2018-09-13: 20:00:00 4 mg via INTRAVENOUS
  Filled 2018-09-13: qty 1

## 2018-09-13 MED ORDER — SODIUM CHLORIDE 0.9 % IV BOLUS
1000.0000 mL | Freq: Once | INTRAVENOUS | Status: AC
  Administered 2018-09-13: 20:00:00 1000 mL via INTRAVENOUS

## 2018-09-13 MED ORDER — KETOROLAC TROMETHAMINE 10 MG PO TABS: 10.0000 mg | ORAL_TABLET | Freq: Three times a day (TID) | ORAL | 0 refills | Status: DC | PRN

## 2018-09-13 MED ORDER — KETOROLAC TROMETHAMINE 30 MG/ML IJ SOLN
30.0000 mg | Freq: Once | INTRAMUSCULAR | Status: AC
  Administered 2018-09-13: 21:00:00 30 mg via INTRAVENOUS
  Filled 2018-09-13: qty 1

## 2018-09-13 MED ORDER — ONDANSETRON HCL 4 MG/2ML IJ SOLN
4.0000 mg | Freq: Once | INTRAMUSCULAR | Status: AC
  Administered 2018-09-13: 20:00:00 4 mg via INTRAVENOUS
  Filled 2018-09-13: qty 2

## 2018-09-13 NOTE — ED Triage Notes (Addendum)
Pt has intermittent left flank pain for 3-4 days  No hx kidney stones.  Pt has nausea. Pt alert.   Pt was at [facility], became pale and sweaty and was sent to er for eval.  Pt pale

## 2018-09-13 NOTE — ED Notes (Signed)
PT informed of need for urine sample and provided cup

## 2018-09-13 NOTE — ED Provider Notes (Signed)
[facility] Emergency Department Provider Note _________________________________________   I have reviewed the triage vital signs and the nursing notes.   HISTORY  Chief Complaint Flank Pain   History limited by: Not Limited   HPI Austin Fuentes is a 38 y.o. male who presents to the emergency department today because of concern for left flank pain. The patient states that the pain started roughly 1 week ago. It has been intermittent. It has had some radiation to his upper flank and groin. The pain has been severe. Has been accompanied by some nausea. The patient has not noticed any change in his urine. Denies any fevers. The patient denies history of any kidney stones.   Records reviewed. Per medical record review patient has a history of stroke  Past Medical History:  Diagnosis Date  . Allergy   . OSA (obstructive sleep apnea) 07/20/2015   To wear CPAP  . Psoriasis 07/20/2015  . Stroke [facility])     Patient Active Problem List   Diagnosis Date Noted  . Preventative health care 01/01/2018  . Morbid obesity ([place]) 01/01/2018  . OSA (obstructive sleep apnea) 07/20/2015  . Medication monitoring encounter 07/20/2015  . Psoriasis 07/20/2015  . Cryptogenic stroke ([place]) 07/09/2015  . Syncope 07/09/2015  . Prediabetes 06/08/2015  . Ostium secundum atrial septal defect   . CVA (cerebral vascular accident) ([place]) 05/28/2015  . Acute ischemic stroke ([place]) 05/28/2015  . Hyperlipidemia 05/28/2015  . Cerebral infarction due to unspecified mechanism   . PFO (patent foramen ovale)   . History of stroke     Past Surgical History:  Procedure Laterality Date  . EP IMPLANTABLE DEVICE N/A 05/31/2015   Procedure: Loop Recorder Insertion;  Surgeon: Sanda Klein, MD;  Location: [facility] CV LAB;  Service: Cardiovascular;  Laterality: N/A;  . TEE WITHOUT CARDIOVERSION N/A 05/31/2015   Procedure: TRANSESOPHAGEAL ECHOCARDIOGRAM (TEE);  Surgeon: Sanda Klein, MD;  Location:  [facility] ENDOSCOPY;  Service: Cardiovascular;  Laterality: N/A;  . TONSILLECTOMY      Prior to Admission medications   Medication Sig Start Date End Date Taking? Authorizing Provider  aspirin 325 MG tablet Take 325 mg by mouth daily.    [provider]  atorvastatin (LIPITOR) 40 MG tablet TAKE 1 TABLET BY MOUTH AT  BEDTIME 08/25/18   Poulose, [name], NP  ibuprofen (ADVIL,MOTRIN) 200 MG tablet Take 200 mg by mouth every 4 (four) hours as needed.    [provider]    Allergies Patient has no known allergies.  Family History  Problem Relation Age of Onset  . Cancer Paternal Grandmother        breast/lung  . Stroke Paternal Grandfather     Social History Social History   Tobacco Use  . Smoking status: Never Smoker  . Smokeless tobacco: Never Used  Substance Use Topics  . Alcohol use: No    Alcohol/week: 0.0 standard drinks  . Drug use: No    Review of Systems Constitutional: No fever/chills Eyes: No visual changes. ENT: No sore throat. Cardiovascular: Denies chest pain. Respiratory: Denies shortness of breath. Gastrointestinal: Positive for left flank pain. Positive for nausea.   Genitourinary: Negative for dysuria. Musculoskeletal: Negative for back pain. Skin: Negative for rash. Neurological: Negative for headaches, focal weakness or numbness.  ____________________________________________   PHYSICAL EXAM:  VITAL SIGNS: ED Triage Vitals  Enc Vitals Group     BP 09/13/18 1906 132/79     Pulse Rate 09/13/18 1906 79     Resp  09/13/18 1906 20     Temp 09/13/18 1906 98.1 F (36.7 C)     Temp Source 09/13/18 1906 Oral     SpO2 09/13/18 1906 95 %     Weight --      Height --      Head Circumference --      Peak Flow --      Pain Score 09/13/18 1907 7     Pain Loc --      Pain Edu? --      Excl. in GC? --      Constitutional: Alert and oriented.  Eyes: Conjunctivae are normal.  ENT      Head: Normocephalic and atraumatic.      Nose: No  congestion/rhinnorhea.      Mouth/Throat: Mucous membranes are moist.      Neck: No stridor. Hematological/Lymphatic/Immunilogical: No cervical lymphadenopathy. Cardiovascular: Normal rate, regular rhythm.  No murmurs, rubs, or gallops.  Respiratory: Normal respiratory effort without tachypnea nor retractions. Breath sounds are clear and equal bilaterally. No wheezes/rales/rhonchi. Gastrointestinal: Soft and tender to palpation in the left abdomen.  Genitourinary: Deferred Musculoskeletal: Normal range of motion in all extremities. No lower extremity edema. Neurologic:  Normal speech and language. No gross focal neurologic deficits are appreciated.  Skin:  Skin is warm, dry and intact. No rash noted. Psychiatric: Mood and affect are normal. Speech and behavior are normal. Patient exhibits appropriate insight and judgment.  ____________________________________________    LABS (pertinent positives/negatives)  CMP wnl except glu 108 CBC wbc 12.5, hgb 14.0, plt 320 UA hazy, moderate hgb disptick, >50 rbc, 6-10 wbc  ____________________________________________   EKG  None  ____________________________________________    RADIOLOGY  CT renal 2mm kidney stone in bladder, mild left hydronephrosis  ____________________________________________   PROCEDURES  Procedures  ____________________________________________   INITIAL IMPRESSION / ASSESSMENT AND PLAN / ED COURSE  Pertinent labs & imaging results that were available during my care of the patient were reviewed by me and considered in my medical decision making (see chart for details).   Patient presented to the emergency department today because of concerns for left flank pain.  Patient's clinical story is concerning for kidney stone and urine did have blood in it.  Patient underwent CT scan which showed a 2 mm stone in his bladder.  At this point I do think that would explain the patient's pain.  I discussed this finding  with the patient.  He did get good relief with Toradol.  Will plan on discharging home.   ____________________________________________   FINAL CLINICAL IMPRESSION(S) / ED DIAGNOSES  Final diagnoses:  Kidney stone  Left flank pain     Note: This dictation was prepared with Dragon dictation. Any transcriptional errors that result from this process are unintentional     Phineas SemenGoodman, [place] Sigmund, MD 09/13/18 2149

## 2018-09-13 NOTE — Discharge Instructions (Addendum)
Please seek medical attention for any high fevers, chest pain, shortness of breath, change in behavior, persistent vomiting, bloody stool or any other new or concerning symptoms.  

## 2018-11-19 ENCOUNTER — Other Ambulatory Visit: Payer: Self-pay

## 2018-12-03 ENCOUNTER — Other Ambulatory Visit: Payer: Self-pay

## 2018-12-04 MED ORDER — ATORVASTATIN CALCIUM 40 MG PO TABS: 40.0000 mg | ORAL_TABLET | Freq: Every day | ORAL | 0 refills | Status: DC

## 2018-12-04 NOTE — Telephone Encounter (Signed)
Please schedule patient for follow up in the next 30 days.  

## 2018-12-04 NOTE — Telephone Encounter (Signed)
vm to sch appy

## 2018-12-17 ENCOUNTER — Other Ambulatory Visit: Payer: Self-pay | Admitting: Family Medicine

## 2019-12-02 DIAGNOSIS — L409 Psoriasis, unspecified: Secondary | ICD-10-CM | POA: Diagnosis not present

## 2019-12-02 DIAGNOSIS — Z79899 Other long term (current) drug therapy: Secondary | ICD-10-CM | POA: Diagnosis not present

## 2019-12-02 DIAGNOSIS — L4050 Arthropathic psoriasis, unspecified: Secondary | ICD-10-CM | POA: Diagnosis not present

## 2023-09-11 ENCOUNTER — Other Ambulatory Visit: Payer: Self-pay | Admitting: Physician Assistant

## 2023-09-11 DIAGNOSIS — M25552 Pain in left hip: Secondary | ICD-10-CM

## 2023-09-25 ENCOUNTER — Ambulatory Visit: Admission: RE | Admit: 2023-09-25 | Source: Ambulatory Visit

## 2023-09-28 ENCOUNTER — Ambulatory Visit
Admission: RE | Admit: 2023-09-28 | Discharge: 2023-09-28 | Disposition: A | Discharge status: Home / Self Care | Source: Ambulatory Visit | Attending: Physician Assistant | Admitting: Physician Assistant

## 2023-09-28 DIAGNOSIS — M25552 Pain in left hip: Secondary | ICD-10-CM | POA: Insufficient documentation

## 2024-02-25 ENCOUNTER — Encounter: Payer: Self-pay | Admitting: Family Medicine

## 2024-02-25 ENCOUNTER — Ambulatory Visit: Admitting: Family Medicine

## 2024-02-25 VITALS — BP 124/78 | HR 99 | Resp 18 | Ht 66.0 in | Wt 343.1 lb

## 2024-02-25 DIAGNOSIS — Z8673 Personal history of transient ischemic attack (TIA), and cerebral infarction without residual deficits: Secondary | ICD-10-CM

## 2024-02-25 DIAGNOSIS — E785 Hyperlipidemia, unspecified: Secondary | ICD-10-CM

## 2024-02-25 DIAGNOSIS — G4733 Obstructive sleep apnea (adult) (pediatric): Secondary | ICD-10-CM | POA: Diagnosis not present

## 2024-02-25 DIAGNOSIS — L4050 Arthropathic psoriasis, unspecified: Secondary | ICD-10-CM | POA: Diagnosis not present

## 2024-02-25 DIAGNOSIS — Z0000 Encounter for general adult medical examination without abnormal findings: Secondary | ICD-10-CM

## 2024-02-25 DIAGNOSIS — R7303 Prediabetes: Secondary | ICD-10-CM | POA: Diagnosis not present

## 2024-02-25 DIAGNOSIS — K219 Gastro-esophageal reflux disease without esophagitis: Secondary | ICD-10-CM

## 2024-02-25 DIAGNOSIS — Z113 Encounter for screening for infections with a predominantly sexual mode of transmission: Secondary | ICD-10-CM

## 2024-02-25 DIAGNOSIS — E559 Vitamin D deficiency, unspecified: Secondary | ICD-10-CM | POA: Diagnosis not present

## 2024-02-25 MED ORDER — PANTOPRAZOLE SODIUM 20 MG PO TBEC: 20.0000 mg | DELAYED_RELEASE_TABLET | Freq: Every evening | ORAL | 0 refills | Status: DC

## 2024-02-25 NOTE — Assessment & Plan Note (Addendum)
 Last A1c 5.6 in 12/2017  -Encouraged dietary changes and increased exercise for management of prediabetes Orders:   Comprehensive Metabolic Panel (CMET)   HgB A1c   Lipid Profile

## 2024-02-25 NOTE — Assessment & Plan Note (Addendum)
 BMI > 40, BMI of 55.38 at today's visit. Note co-morbidities  include diagnoses of hx of CVA, hyperlipidemia, OSA, and prediabetes.   -Discussed starting GLP1 medication for weight loss management. Denies hx of pancreatitis. Denies family hx of thyroid cancer -Discussed that regardless of pharmacologic management that lifestyle changes are needed, this including dietary changes and increasing exercise.  -Reviewed Mediterranean diet and written education provided at end of visit for additional review and support . Discussed goal of at least 150 minutes of exercise weekly, however encouraged starting with setting a goal for 1 or 2 days of exercise for a 30 minute time period and increasing as he continues to meet this goal. He is receptive  Orders:   Comprehensive Metabolic Panel (CMET)   TSH

## 2024-02-25 NOTE — Assessment & Plan Note (Addendum)
 Diagnosed with OSA after sleep study performed in 06/2015 where he was diagnosed with an AHI of 41.1, REM dependent AHI of 78. Sleep study results reported in 09/2015 neurology office visit note, unable to locate sleep study report myself. Sleep study results reported, AHI of 41.1, most consistent with severe obstructive sleep apnea (severe OSA).   Admits to CPAP use, however not consistent. No longer follows with neurology as noted.  -Reinforced importance of CPAP use. Discussing that uncontrolled OSA can contribute to difficulty with weight loss, and also can impact energy levels contributing to patient complaints of fatigue Orders:   CBC w/Diff/Platelet   Comprehensive Metabolic Panel (CMET)   Lipid Profile

## 2024-02-25 NOTE — Assessment & Plan Note (Addendum)
 Last lipid panel located with LDL of 108 in 12/2017 Previously on statin therapy however has not taken Atorvastatin  in a while.   -Consider restarting statin therapy with hx of CVA pending labs Orders:   Comprehensive Metabolic Panel (CMET)   Lipid Profile

## 2024-02-25 NOTE — Progress Notes (Signed)
 "  New Patient Office Visit  Subjective    Patient ID: Austin Fuentes, male    DOB: [birth year]  Age: 43 y.o. MRN: [id number]  CC:  Chief Complaint  Patient presents with   Establish Care   Obesity    Wants rx for weight loss med    HPI Austin Fuentes presents to establish care. He is a pleasant 43 year old male who voices interest in discussing weight loss management. He admits diet has been poor; he has not been following a diet plan per say. He reports his job is active. He is a social research officer, government at [employer]. He voices exercise has been limited due to his hip pain. He reports this year his left hip began hurting. He was seen by ortho and diagnosed with transient osteoporosis. He was last seen by ortho in 09/2023 per chart review.   He also complains of heartburn and acid reflux. He has been taking OTC Omeprazole and OTC Tums without true relief of symptoms. He voices if he sleeps upright symptoms are typically better. He denies eating and going to bed, often up for several hours before bedtime.   Outpatient Encounter Medications as of 02/25/2024  Medication Sig   aspirin  325 MG tablet Take 325 mg by mouth daily.   atorvastatin  (LIPITOR ) 40 MG tablet Take 1 tablet (40 mg total) by mouth at bedtime. (Patient not taking: Reported on 02/25/2024)   Guselkumab (TREMFYA ONE-PRESS) 100 MG/ML SOPN Inject 100 mLs into the skin. (Patient not taking: Reported on 02/25/2024)   ibuprofen (ADVIL,MOTRIN) 200 MG tablet Take 200 mg by mouth every 4 (four) hours as needed.   ketorolac  (TORADOL ) 10 MG tablet Take 1 tablet (10 mg total) by mouth every 8 (eight) hours as needed for severe pain.   No facility-administered encounter medications on file as of 02/25/2024.    Past Medical History:  Diagnosis Date   Allergy    OSA (obstructive sleep apnea) 07/20/2015   To wear CPAP   Psoriasis 07/20/2015   Psoriatic arthritis (HCC)    Stroke (HCC)    Transient osteoporosis of hip     Past Surgical History:   Procedure Laterality Date   EP IMPLANTABLE DEVICE N/A 05/31/2015   Procedure: Loop Recorder Insertion;  Surgeon: Jerel Balding, MD;  Location: [facility];  Service: Cardiovascular;  Laterality: N/A;   TEE WITHOUT CARDIOVERSION N/A 05/31/2015   Procedure: TRANSESOPHAGEAL ECHOCARDIOGRAM (TEE);  Surgeon: Jerel Balding, MD;  Location: [facility];  Service: Cardiovascular;  Laterality: N/A;   TONSILLECTOMY      Family History  Problem Relation Age of Onset   Cancer Paternal Grandmother        breast/lung   Stroke Paternal Grandfather     Social History   Socioeconomic History   Marital status: Divorced    Spouse name: Austin Fuentes   Number of children: 2   Years of education: 12   Highest education level: Some college, no degree  Occupational History   Not on file  Tobacco Use   Smoking status: Never   Smokeless tobacco: Never  Vaping Use   Vaping status: Never Used  Substance and Sexual Activity   Alcohol use: No    Alcohol/week: 0.0 standard drinks of alcohol   Drug use: No   Sexual activity: Not Currently  Other Topics Concern   Not on file  Social History Narrative   Not on file   Social Drivers of Health   Tobacco Use: Low Risk (  02/25/2024)   Patient History    Smoking Tobacco Use: Never    Smokeless Tobacco Use: Never    Passive Exposure: Not on file  Financial Resource Strain: Low Risk (02/25/2024)   Overall Financial Resource Strain (CARDIA)    Difficulty of Paying Living Expenses: Not hard at all  Food Insecurity: No Food Insecurity (02/25/2024)   Epic    Worried About Programme Researcher, Broadcasting/film/video in the Last Year: Never true    Ran Out of Food in the Last Year: Never true  Transportation Needs: No Transportation Needs (02/25/2024)   Epic    Lack of Transportation (Medical): No    Lack of Transportation (Non-Medical): No  Physical Activity: Inactive (02/25/2024)   Exercise Vital Sign    Days of Exercise per Week: 0 days    Minutes of Exercise per Session:  0 min  Stress: No Stress Concern Present (02/25/2024)   [employer] of Occupational Health - Occupational Stress Questionnaire    Feeling of Stress: Not at all  Social Connections: Moderately Isolated (02/25/2024)   Social Connection and Isolation Panel    Frequency of Communication with Friends and Family: Twice a week    Frequency of Social Gatherings with Friends and Family: Once a week    Attends Religious Services: Never    Database Administrator or Organizations: Yes    Attends Banker Meetings: Never    Marital Status: Divorced  Catering Manager Violence: Not At Risk (02/25/2024)   Epic    Fear of Current or Ex-Partner: No    Emotionally Abused: No    Physically Abused: No    Sexually Abused: No  Depression (PHQ2-9): Low Risk (02/25/2024)   Depression (PHQ2-9)    PHQ-2 Score: 0  Alcohol Screen: Low Risk (02/25/2024)   Alcohol Screen    Last Alcohol Screening Score (AUDIT): 0  Housing: Unknown (02/25/2024)   Epic    Unable to Pay for Housing in the Last Year: No    Number of Times Moved in the Last Year: Not on file    Homeless in the Last Year: No  Utilities: Not At Risk (02/25/2024)   Epic    Threatened with loss of utilities: No  Health Literacy: Adequate Health Literacy (02/25/2024)   B1300 Health Literacy    Frequency of need for help with medical instructions: Never    Review of Systems  Constitutional:  Positive for malaise/fatigue. Negative for diaphoresis and fever.  Respiratory:  Negative for shortness of breath.   Cardiovascular:  Negative for chest pain and palpitations.  Gastrointestinal:  Positive for heartburn. Negative for abdominal pain, blood in stool, constipation, diarrhea, nausea and vomiting.  Neurological:  Negative for dizziness and headaches.  Psychiatric/Behavioral:  Negative for depression. The patient is not nervous/anxious and does not have insomnia.         Objective    BP 124/78   Pulse 99   Resp 18   Ht 5'  6 (1.676 m)   Wt (!) 343 lb 1.6 oz (155.6 kg)   SpO2 94%   BMI 55.38 kg/m   Physical Exam Constitutional:      Appearance: He is obese.  HENT:     Head: Normocephalic and atraumatic.  Neck:     Vascular: No carotid bruit.  Cardiovascular:     Rate and Rhythm: Normal rate and regular rhythm.     Heart sounds: Normal heart sounds.  Pulmonary:     Effort: Pulmonary effort is normal.  Breath sounds: Normal breath sounds. No wheezing, rhonchi or rales.  Skin:    General: Skin is warm and dry.  Neurological:     General: No focal deficit present.     Mental Status: He is alert and oriented to person, place, and time.  Psychiatric:        Mood and Affect: Mood normal.        Behavior: Behavior normal.       Assessment & Plan:   Assessment & Plan History of CVA (cerebrovascular accident) Hx of CVA in 2017. He followed with neurology however has not been seen since 2017. CVA in 2017, considered cryptogenic stroke, and denies recurrence. Previously taking Aspirin  325mg  however has not recently taken medication.  -Advised to start Aspirin  81mg  daily. He voices understanding and is agreeable.  Orders:   CBC w/Diff/Platelet   Lipid Profile  Morbid obesity (HCC) BMI > 40, BMI of 55.38 at today's visit. Note co-morbidities  include diagnoses of hx of CVA, hyperlipidemia, OSA, and prediabetes.   -Discussed starting GLP1 medication for weight loss management. Denies hx of pancreatitis. Denies family hx of thyroid cancer -Discussed that regardless of pharmacologic management that lifestyle changes are needed, this including dietary changes and increasing exercise.  -Reviewed Mediterranean diet and written education provided at end of visit for additional review and support . Discussed goal of at least 150 minutes of exercise weekly, however encouraged starting with setting a goal for 1 or 2 days of exercise for a 30 minute time period and increasing as he continues to meet this  goal. He is receptive  Orders:   Comprehensive Metabolic Panel (CMET)   TSH  OSA (obstructive sleep apnea) Diagnosed with OSA after sleep study performed in 06/2015 where he was diagnosed with an AHI of 41.1, REM dependent AHI of 78. Sleep study results reported in 09/2015 neurology office visit note, unable to locate sleep study report myself. Sleep study results reported, AHI of 41.1, most consistent with severe obstructive sleep apnea (severe OSA).   Admits to CPAP use, however not consistent. No longer follows with neurology as noted.  -Reinforced importance of CPAP use. Discussing that uncontrolled OSA can contribute to difficulty with weight loss, and also can impact energy levels contributing to patient complaints of fatigue Orders:   CBC w/Diff/Platelet   Comprehensive Metabolic Panel (CMET)   Lipid Profile  Prediabetes Last A1c 5.6 in 12/2017  -Encouraged dietary changes and increased exercise for management of prediabetes Orders:   Comprehensive Metabolic Panel (CMET)   HgB A1c   Lipid Profile  Hyperlipidemia, unspecified hyperlipidemia type Last lipid panel located with LDL of 108 in 12/2017 Previously on statin therapy however has not taken Atorvastatin  in a while.   -Consider restarting statin therapy with hx of CVA pending labs Orders:   Comprehensive Metabolic Panel (CMET)   Lipid Profile  Psoriatic arthritis (HCC) Follows with rheumatology for management of psoriatic arthritis. Last rheumatology visit 06/2023. He had been taking Tremfya for management of psoriatic arthritis however voices recent change in price with insurance. Advised that he follow up with rheumatology for medication management and management of psoriatic arthritis.  Orders:   CBC w/Diff/Platelet  Vitamin D  deficiency Hx of osteoporosis, followed by ortho for management of this and left hip pain complaints. Vitamin D  deficiency previously uncontrolled, last vitamin D  level 16 in 12/2017.    -Update labs today Orders:   Vitamin D  (25 hydroxy)  Gastroesophageal reflux disease, unspecified whether esophagitis present Patient complains of heart  burn and acid reflux, particularly at night. He has taken OTC Omeprazole and Tums without relief of symptoms.  -Discussed dietary changes for management of GERD in addition to other lifestyle changes including weight loss and increased activity, example given of a short walk after a meal -Advised to remain upright for at least 30 minutes to 1 hour after eating meals -Start Pantoprazole 20mg  every evening prior to evening meal for management of GERD -Return in 1 month for f/u  Orders:   pantoprazole (PROTONIX) 20 MG tablet; Take 1 tablet (20 mg total) by mouth every evening.  Healthcare maintenance -Declines vaccines at time of visit  -CRC screening not due, N/A -PSA level not due, N/A Orders:   CBC w/Diff/Platelet   Lipid Profile   HIV antibody (with reflex)   Hepatitis C Antibody  Screening examination for STD (sexually transmitted disease) HIV and Hep C screening completed during today's visit.  Orders:   HIV antibody (with reflex)   Hepatitis C Antibody     Return in about 1 month (around 03/27/2024) for weight management follow up .   LAYMON LOISE CORE, FNP   "

## 2024-02-26 ENCOUNTER — Other Ambulatory Visit (HOSPITAL_COMMUNITY): Payer: Self-pay

## 2024-02-26 ENCOUNTER — Ambulatory Visit: Payer: Self-pay | Admitting: Family Medicine

## 2024-02-26 ENCOUNTER — Telehealth: Payer: Self-pay

## 2024-02-26 DIAGNOSIS — E559 Vitamin D deficiency, unspecified: Secondary | ICD-10-CM

## 2024-02-26 DIAGNOSIS — E785 Hyperlipidemia, unspecified: Secondary | ICD-10-CM

## 2024-02-26 DIAGNOSIS — G4733 Obstructive sleep apnea (adult) (pediatric): Secondary | ICD-10-CM

## 2024-02-26 LAB — COMPREHENSIVE METABOLIC PANEL WITH GFR
AG Ratio: 1.4 (calc) (ref 1.0–2.5)
ALT: 26 U/L (ref 9–46)
AST: 19 U/L (ref 10–40)
Albumin: 4.2 g/dL (ref 3.6–5.1)
Alkaline phosphatase (APISO): 80 U/L (ref 36–130)
BUN: 11 mg/dL (ref 7–25)
CO2: 29 mmol/L (ref 20–32)
Calcium: 9.2 mg/dL (ref 8.6–10.3)
Chloride: 102 mmol/L (ref 98–110)
Creat: 0.9 mg/dL (ref 0.60–1.29)
Globulin: 2.9 g/dL (ref 1.9–3.7)
Glucose, Bld: 91 mg/dL (ref 65–99)
Potassium: 4.9 mmol/L (ref 3.5–5.3)
Sodium: 141 mmol/L (ref 135–146)
Total Bilirubin: 0.5 mg/dL (ref 0.2–1.2)
Total Protein: 7.1 g/dL (ref 6.1–8.1)
eGFR: 109 mL/min/1.73m2

## 2024-02-26 LAB — CBC WITH DIFFERENTIAL/PLATELET
Absolute Lymphocytes: 2189 {cells}/uL (ref 850–3900)
Absolute Monocytes: 468 {cells}/uL (ref 200–950)
Basophils Absolute: 29 {cells}/uL (ref 0–200)
Basophils Relative: 0.4 %
Eosinophils Absolute: 202 {cells}/uL (ref 15–500)
Eosinophils Relative: 2.8 %
HCT: 50.8 % (ref 39.4–51.1)
Hemoglobin: 15.1 g/dL (ref 13.2–17.1)
MCH: 24 pg — ABNORMAL LOW (ref 27.0–33.0)
MCHC: 29.7 g/dL — ABNORMAL LOW (ref 31.6–35.4)
MCV: 80.9 fL — ABNORMAL LOW (ref 81.4–101.7)
MPV: 9.7 fL (ref 7.5–12.5)
Monocytes Relative: 6.5 %
Neutro Abs: 4313 {cells}/uL (ref 1500–7800)
Neutrophils Relative %: 59.9 %
Platelets: 347 Thousand/uL (ref 140–400)
RBC: 6.28 Million/uL — ABNORMAL HIGH (ref 4.20–5.80)
RDW: 13.6 % (ref 11.0–15.0)
Total Lymphocyte: 30.4 %
WBC: 7.2 Thousand/uL (ref 3.8–10.8)

## 2024-02-26 LAB — LIPID PANEL
Cholesterol: 219 mg/dL — ABNORMAL HIGH
HDL: 47 mg/dL
LDL Cholesterol (Calc): 138 mg/dL — ABNORMAL HIGH
Non-HDL Cholesterol (Calc): 172 mg/dL — ABNORMAL HIGH
Total CHOL/HDL Ratio: 4.7 (calc)
Triglycerides: 195 mg/dL — ABNORMAL HIGH

## 2024-02-26 LAB — HEPATITIS C ANTIBODY: Hepatitis C Ab: NONREACTIVE

## 2024-02-26 LAB — HEMOGLOBIN A1C
Hgb A1c MFr Bld: 6.2 % — ABNORMAL HIGH
Mean Plasma Glucose: 131 mg/dL
eAG (mmol/L): 7.3 mmol/L

## 2024-02-26 LAB — TSH: TSH: 1.98 m[IU]/L (ref 0.40–4.50)

## 2024-02-26 LAB — VITAMIN D 25 HYDROXY (VIT D DEFICIENCY, FRACTURES): Vit D, 25-Hydroxy: 15 ng/mL — ABNORMAL LOW (ref 30–100)

## 2024-02-26 LAB — HIV ANTIBODY (ROUTINE TESTING W REFLEX)
HIV 1&2 Ab, 4th Generation: NONREACTIVE
HIV FINAL INTERPRETATION: NEGATIVE

## 2024-02-26 MED ORDER — ZEPBOUND 2.5 MG/0.5ML ~~LOC~~ SOAJ: 2.5000 mg | SUBCUTANEOUS | 0 refills | Status: DC

## 2024-02-26 MED ORDER — ATORVASTATIN CALCIUM 40 MG PO TABS: 40.0000 mg | ORAL_TABLET | Freq: Every day | ORAL | 0 refills | Status: DC

## 2024-02-26 MED ORDER — VITAMIN D (ERGOCALCIFEROL) 1.25 MG (50000 UNIT) PO CAPS: 50000.0000 [IU] | ORAL_CAPSULE | ORAL | 0 refills | Status: DC

## 2024-02-26 NOTE — Telephone Encounter (Signed)
 Pharmacy Patient Advocate Encounter  Received notification from [employer] that Prior Authorization for Zepbound 2.5 has been APPROVED from 02/26/24 to 08/26/24   PA #/Case ID/Reference #: # [id number]

## 2024-02-26 NOTE — Telephone Encounter (Signed)
 Pharmacy Patient Advocate Encounter   Received notification from Onbase that prior authorization for Zepbound 2.5 is required/requested.   Insurance verification completed.   The patient is insured through [facility].   Per test claim: PA required; PA submitted to above mentioned insurance via Latent Key/confirmation #/EOC [facility] Status is pending

## 2024-03-27 ENCOUNTER — Encounter: Payer: Self-pay | Admitting: Family Medicine

## 2024-03-27 ENCOUNTER — Ambulatory Visit (INDEPENDENT_AMBULATORY_CARE_PROVIDER_SITE_OTHER): Admitting: Family Medicine

## 2024-03-27 VITALS — BP 132/74 | HR 90 | Resp 16 | Ht 66.0 in | Wt 339.0 lb

## 2024-03-27 DIAGNOSIS — G4733 Obstructive sleep apnea (adult) (pediatric): Secondary | ICD-10-CM | POA: Diagnosis not present

## 2024-03-27 DIAGNOSIS — E559 Vitamin D deficiency, unspecified: Secondary | ICD-10-CM | POA: Diagnosis not present

## 2024-03-27 DIAGNOSIS — R7303 Prediabetes: Secondary | ICD-10-CM

## 2024-03-27 DIAGNOSIS — K219 Gastro-esophageal reflux disease without esophagitis: Secondary | ICD-10-CM | POA: Insufficient documentation

## 2024-03-27 DIAGNOSIS — E785 Hyperlipidemia, unspecified: Secondary | ICD-10-CM

## 2024-03-27 DIAGNOSIS — Z8673 Personal history of transient ischemic attack (TIA), and cerebral infarction without residual deficits: Secondary | ICD-10-CM

## 2024-03-27 MED ORDER — VITAMIN D (ERGOCALCIFEROL) 1.25 MG (50000 UNIT) PO CAPS: 50000.0000 [IU] | ORAL_CAPSULE | ORAL | 1 refills | Status: AC

## 2024-03-27 MED ORDER — ATORVASTATIN CALCIUM 40 MG PO TABS: 40.0000 mg | ORAL_TABLET | Freq: Every day | ORAL | 1 refills | Status: AC

## 2024-03-27 MED ORDER — PANTOPRAZOLE SODIUM 20 MG PO TBEC: 20.0000 mg | DELAYED_RELEASE_TABLET | Freq: Every evening | ORAL | 1 refills | Status: AC

## 2024-03-27 NOTE — Progress Notes (Signed)
 "  Established Patient Office Visit  Subjective   Patient ID: Austin Fuentes, male    DOB: 08/26/1980  Age: 44 y.o. MRN: [id number]  Chief Complaint  Patient presents with   Follow-up    1 month   Weight Check    HPI Austin Fuentes is a pleasant 44 year old male returning for one month follow up.   F/u on weight loss. 4lb weight loss since his last visit. He has started Zepbound  however this is coming from [employer] due to insurance and employer requirements. He has started Zepbound  2.5mg  and only has completed one injection, starting last Thursday. His next injection is due today. He reports overall he has tolerated medication well. Denies nausea, vomiting, constipation, diarrhea or abdominal pain.   F/u on OSA and he admits he has not improved CPAP compliance. He admits he is non-compliant with CPAP use but is motivated to restart. Advised he resume CPAP therapy for management of OSA.    F/u on hyperlipidemia . He reports he has restarted Atorvastatin , voicing compliance with statin therapy. Advised to continue Atorvastatin  40mg  nightly.   F/u on vitamin D  deficiency. Vitamin D  deficiency uncontrolled and Ergocalciferol  was started after receiving lab results, which he reports compliance with Ergocalciferol . Continue Ergocalciferol  once weekly.   F/u on GERD and he voices symptoms have been well controlled with Pantoprazole  20mg  daily.     Review of Systems  All other systems reviewed and are negative.     Objective:     BP 132/74   Pulse 90   Resp 16   Ht 5' 6 (1.676 m)   Wt (!) 339 lb (153.8 kg)   SpO2 98%   BMI 54.72 kg/m  BP Readings from Last 3 Encounters:  03/27/24 132/74  02/25/24 124/78  09/13/18 126/82   Wt Readings from Last 3 Encounters:  03/27/24 (!) 339 lb (153.8 kg)  02/25/24 (!) 343 lb 1.6 oz (155.6 kg)  09/28/23 273 lb (123.8 kg)      Physical Exam Constitutional:      General: He is not in acute distress.    Appearance: He is obese.   Cardiovascular:     Rate and Rhythm: Normal rate and regular rhythm.     Heart sounds: Normal heart sounds.  Pulmonary:     Effort: Pulmonary effort is normal. No respiratory distress.     Breath sounds: Normal breath sounds.  Skin:    General: Skin is warm and dry.  Neurological:     General: No focal deficit present.     Mental Status: He is alert. Mental status is at baseline.  Psychiatric:        Mood and Affect: Mood normal.        Behavior: Behavior normal.     Last CBC Lab Results  Component Value Date   WBC 7.2 02/25/2024   HGB 15.1 02/25/2024   HCT 50.8 02/25/2024   MCV 80.9 (L) 02/25/2024   MCH 24.0 (L) 02/25/2024   RDW 13.6 02/25/2024   PLT 347 02/25/2024   Last metabolic panel Lab Results  Component Value Date   GLUCOSE 91 02/25/2024   NA 141 02/25/2024   K 4.9 02/25/2024   CL 102 02/25/2024   CO2 29 02/25/2024   BUN 11 02/25/2024   CREATININE 0.90 02/25/2024   EGFR 109 02/25/2024   CALCIUM  9.2 02/25/2024   PROT 7.1 02/25/2024   ALBUMIN 4.6 09/13/2018   BILITOT 0.5 02/25/2024   ALKPHOS 59 09/13/2018  AST 19 02/25/2024   ALT 26 02/25/2024   ANIONGAP 9 09/13/2018   Last lipids Lab Results  Component Value Date   CHOL 219 (H) 02/25/2024   HDL 47 02/25/2024   LDLCALC 138 (H) 02/25/2024   TRIG 195 (H) 02/25/2024   CHOLHDL 4.7 02/25/2024   Last hemoglobin A1c Lab Results  Component Value Date   HGBA1C 6.2 (H) 02/25/2024   Last thyroid functions Lab Results  Component Value Date   TSH 1.98 02/25/2024   Last vitamin D  Lab Results  Component Value Date   VD25OH 15 (L) 02/25/2024      Assessment & Plan:   Assessment & Plan Hyperlipidemia, unspecified hyperlipidemia type Hyperlipidemia uncontrolled, last LDL 138 in 01/2024.  -Continue Atorvastatin  40mg  nightly, refill provided -Continue with lifestyle modifications including dietary changes, exercise and weight loss -Return in 3 months for f/u, plan to repeat fasting labs at time  of f/u  Orders:   atorvastatin  (LIPITOR ) 40 MG tablet; Take 1 tablet (40 mg total) by mouth at bedtime.  Morbid obesity (HCC) Morbid obesity with BMI of 54.72 and comorbid conditions as noted. Weight loss successful, noting weight loss of 4lbs since last visit.   -Continue with Zepbound  as directed -Continue with lifestyle modifications  -Return in 3 months for f/u     OSA (obstructive sleep apnea) OSA and non-compliance with CPAP.   -Recommended resuming CPAP therapy -Continue with Zepbound  for weight loss management and management of OSA    Vitamin D  deficiency Vitamin D  deficiency uncontrolled, last vitamin D  level 15 in 01/2024  -Continue Ergocalciferol  1.25mg  once weekly, refill provided -Return in 3 months for f/u, plan to repeat vitamin D  level at time of f/u  Orders:   Vitamin D , Ergocalciferol , (DRISDOL ) 1.25 MG (50000 UNIT) CAPS capsule; Take 1 capsule (50,000 Units total) by mouth every 7 (seven) days.  Prediabetes Prediabetes with last A1c of 6.2 in 01/2024  -Continue with weight loss, dietary changes and exercise     History of CVA (cerebrovascular accident) Patient with hx of CVA. He has restarted Aspirin  81mg  as discussed at our last visit.  Continue with secondary stroke prevention including Aspirin  81mg  daily and statin therapy.  -Maintain adequate lipid control, restart Atorvastatin  40mg  nightly -Continue Aspirin  81mg  daily  Orders:   atorvastatin  (LIPITOR ) 40 MG tablet; Take 1 tablet (40 mg total) by mouth at bedtime.  Gastroesophageal reflux disease, unspecified whether esophagitis present GERD controlled with Pantoprazole  20mg  daily.   -Continue Pantoprazole  20mg  daily, refill provided  Orders:   pantoprazole  (PROTONIX ) 20 MG tablet; Take 1 tablet (20 mg total) by mouth every evening.      Return in about 3 months (around 06/25/2024).    LAYMON LOISE CORE, FNP "

## 2024-03-27 NOTE — Assessment & Plan Note (Addendum)
 OSA and non-compliance with CPAP.   -Recommended resuming CPAP therapy -Continue with Zepbound  for weight loss management and management of OSA

## 2024-03-27 NOTE — Assessment & Plan Note (Addendum)
 GERD controlled with Pantoprazole  20mg  daily.   -Continue Pantoprazole  20mg  daily, refill provided  Orders:   pantoprazole  (PROTONIX ) 20 MG tablet; Take 1 tablet (20 mg total) by mouth every evening.

## 2024-03-27 NOTE — Assessment & Plan Note (Addendum)
 Vitamin D  deficiency uncontrolled, last vitamin D  level 15 in 01/2024  -Continue Ergocalciferol  1.25mg  once weekly, refill provided -Return in 3 months for f/u, plan to repeat vitamin D  level at time of f/u  Orders:   Vitamin D , Ergocalciferol , (DRISDOL ) 1.25 MG (50000 UNIT) CAPS capsule; Take 1 capsule (50,000 Units total) by mouth every 7 (seven) days.

## 2024-03-27 NOTE — Assessment & Plan Note (Addendum)
 Prediabetes with last A1c of 6.2 in 01/2024  -Continue with weight loss, dietary changes and exercise

## 2024-03-27 NOTE — Assessment & Plan Note (Addendum)
 Morbid obesity with BMI of 54.72 and comorbid conditions as noted. Weight loss successful, noting weight loss of 4lbs since last visit.   -Continue with Zepbound  as directed -Continue with lifestyle modifications  -Return in 3 months for f/u

## 2024-03-27 NOTE — Assessment & Plan Note (Addendum)
 Hyperlipidemia uncontrolled, last LDL 138 in 01/2024.  -Continue Atorvastatin  40mg  nightly, refill provided -Continue with lifestyle modifications including dietary changes, exercise and weight loss -Return in 3 months for f/u, plan to repeat fasting labs at time of f/u  Orders:   atorvastatin  (LIPITOR ) 40 MG tablet; Take 1 tablet (40 mg total) by mouth at bedtime.

## 2024-06-26 ENCOUNTER — Ambulatory Visit: Admitting: Family Medicine
